# Patient Record
Sex: Female | Born: 1945 | Race: White | Hispanic: No | Marital: Married | Smoking: Never smoker
Health system: Southern US, Community
[De-identification: ages and names within clinical notes are randomized; demographics above are authoritative.]

## PROBLEM LIST (undated history)

## (undated) DIAGNOSIS — H269 Unspecified cataract: Secondary | ICD-10-CM

## (undated) DIAGNOSIS — R0602 Shortness of breath: Secondary | ICD-10-CM

## (undated) DIAGNOSIS — E782 Mixed hyperlipidemia: Secondary | ICD-10-CM

## (undated) DIAGNOSIS — T7840XA Allergy, unspecified, initial encounter: Secondary | ICD-10-CM

## (undated) DIAGNOSIS — M8580 Other specified disorders of bone density and structure, unspecified site: Secondary | ICD-10-CM

## (undated) DIAGNOSIS — T8549XA Other mechanical complication of breast prosthesis and implant, initial encounter: Secondary | ICD-10-CM

## (undated) HISTORY — PX: ABDOMINAL HYSTERECTOMY: SHX81

## (undated) HISTORY — DX: Allergy, unspecified, initial encounter: T78.40XA

## (undated) HISTORY — PX: HERNIA REPAIR: SHX51

## (undated) HISTORY — DX: Other mechanical complication of breast prosthesis and implant, initial encounter: T85.49XA

## (undated) HISTORY — DX: Other specified disorders of bone density and structure, unspecified site: M85.80

## (undated) HISTORY — DX: Shortness of breath: R06.02

## (undated) HISTORY — DX: Mixed hyperlipidemia: E78.2

## (undated) HISTORY — PX: BREAST BIOPSY: SHX20

## (undated) HISTORY — DX: Unspecified cataract: H26.9

---

## 2010-05-09 HISTORY — PX: AUGMENTATION MAMMAPLASTY: SUR837

## 2014-05-15 DIAGNOSIS — M810 Age-related osteoporosis without current pathological fracture: Secondary | ICD-10-CM | POA: Diagnosis not present

## 2014-05-23 DIAGNOSIS — M810 Age-related osteoporosis without current pathological fracture: Secondary | ICD-10-CM | POA: Diagnosis not present

## 2014-06-24 DIAGNOSIS — M810 Age-related osteoporosis without current pathological fracture: Secondary | ICD-10-CM | POA: Diagnosis not present

## 2015-02-21 DIAGNOSIS — Z23 Encounter for immunization: Secondary | ICD-10-CM | POA: Diagnosis not present

## 2015-06-23 DIAGNOSIS — Z1231 Encounter for screening mammogram for malignant neoplasm of breast: Secondary | ICD-10-CM | POA: Diagnosis not present

## 2015-06-23 DIAGNOSIS — N951 Menopausal and female climacteric states: Secondary | ICD-10-CM | POA: Diagnosis not present

## 2015-06-23 DIAGNOSIS — F330 Major depressive disorder, recurrent, mild: Secondary | ICD-10-CM | POA: Diagnosis not present

## 2015-06-23 DIAGNOSIS — H6121 Impacted cerumen, right ear: Secondary | ICD-10-CM | POA: Diagnosis not present

## 2015-06-29 DIAGNOSIS — L821 Other seborrheic keratosis: Secondary | ICD-10-CM | POA: Diagnosis not present

## 2015-06-29 DIAGNOSIS — L219 Seborrheic dermatitis, unspecified: Secondary | ICD-10-CM | POA: Diagnosis not present

## 2015-06-29 DIAGNOSIS — L814 Other melanin hyperpigmentation: Secondary | ICD-10-CM | POA: Diagnosis not present

## 2015-06-29 DIAGNOSIS — L719 Rosacea, unspecified: Secondary | ICD-10-CM | POA: Diagnosis not present

## 2015-06-29 DIAGNOSIS — D225 Melanocytic nevi of trunk: Secondary | ICD-10-CM | POA: Diagnosis not present

## 2015-06-29 DIAGNOSIS — D1801 Hemangioma of skin and subcutaneous tissue: Secondary | ICD-10-CM | POA: Diagnosis not present

## 2015-06-29 DIAGNOSIS — I781 Nevus, non-neoplastic: Secondary | ICD-10-CM | POA: Diagnosis not present

## 2015-06-29 DIAGNOSIS — Z23 Encounter for immunization: Secondary | ICD-10-CM | POA: Diagnosis not present

## 2015-07-24 DIAGNOSIS — H2513 Age-related nuclear cataract, bilateral: Secondary | ICD-10-CM | POA: Diagnosis not present

## 2015-07-24 DIAGNOSIS — H52203 Unspecified astigmatism, bilateral: Secondary | ICD-10-CM | POA: Diagnosis not present

## 2015-08-24 ENCOUNTER — Other Ambulatory Visit: Payer: Self-pay | Admitting: Family Medicine

## 2015-08-24 DIAGNOSIS — K7689 Other specified diseases of liver: Secondary | ICD-10-CM | POA: Diagnosis not present

## 2015-08-24 DIAGNOSIS — Z8505 Personal history of malignant neoplasm of liver: Secondary | ICD-10-CM | POA: Diagnosis not present

## 2015-08-24 DIAGNOSIS — N952 Postmenopausal atrophic vaginitis: Secondary | ICD-10-CM | POA: Diagnosis not present

## 2015-08-24 DIAGNOSIS — R32 Unspecified urinary incontinence: Secondary | ICD-10-CM | POA: Diagnosis not present

## 2015-08-24 DIAGNOSIS — Z1231 Encounter for screening mammogram for malignant neoplasm of breast: Secondary | ICD-10-CM

## 2015-08-24 DIAGNOSIS — B159 Hepatitis A without hepatic coma: Secondary | ICD-10-CM | POA: Diagnosis not present

## 2015-08-24 DIAGNOSIS — M810 Age-related osteoporosis without current pathological fracture: Secondary | ICD-10-CM | POA: Diagnosis not present

## 2015-09-15 ENCOUNTER — Ambulatory Visit
Admission: RE | Admit: 2015-09-15 | Discharge: 2015-09-15 | Disposition: A | Discharge status: Home / Self Care | Payer: Medicare Other | Source: Ambulatory Visit | Attending: Family Medicine | Admitting: Family Medicine

## 2015-09-15 DIAGNOSIS — Z1231 Encounter for screening mammogram for malignant neoplasm of breast: Secondary | ICD-10-CM | POA: Diagnosis not present

## 2015-11-17 DIAGNOSIS — Z136 Encounter for screening for cardiovascular disorders: Secondary | ICD-10-CM | POA: Diagnosis not present

## 2015-11-17 DIAGNOSIS — Z0000 Encounter for general adult medical examination without abnormal findings: Secondary | ICD-10-CM | POA: Diagnosis not present

## 2015-11-17 DIAGNOSIS — N952 Postmenopausal atrophic vaginitis: Secondary | ICD-10-CM | POA: Diagnosis not present

## 2015-11-17 DIAGNOSIS — Z23 Encounter for immunization: Secondary | ICD-10-CM | POA: Diagnosis not present

## 2015-11-17 DIAGNOSIS — F331 Major depressive disorder, recurrent, moderate: Secondary | ICD-10-CM | POA: Diagnosis not present

## 2015-11-17 DIAGNOSIS — Z1211 Encounter for screening for malignant neoplasm of colon: Secondary | ICD-10-CM | POA: Diagnosis not present

## 2015-11-17 DIAGNOSIS — F411 Generalized anxiety disorder: Secondary | ICD-10-CM | POA: Diagnosis not present

## 2015-11-17 DIAGNOSIS — R32 Unspecified urinary incontinence: Secondary | ICD-10-CM | POA: Diagnosis not present

## 2015-12-08 DIAGNOSIS — D1800 Hemangioma unspecified site: Secondary | ICD-10-CM | POA: Diagnosis not present

## 2015-12-08 DIAGNOSIS — L719 Rosacea, unspecified: Secondary | ICD-10-CM | POA: Diagnosis not present

## 2015-12-08 DIAGNOSIS — D229 Melanocytic nevi, unspecified: Secondary | ICD-10-CM | POA: Diagnosis not present

## 2015-12-08 DIAGNOSIS — L219 Seborrheic dermatitis, unspecified: Secondary | ICD-10-CM | POA: Diagnosis not present

## 2015-12-08 DIAGNOSIS — L814 Other melanin hyperpigmentation: Secondary | ICD-10-CM | POA: Diagnosis not present

## 2015-12-08 DIAGNOSIS — L821 Other seborrheic keratosis: Secondary | ICD-10-CM | POA: Diagnosis not present

## 2015-12-24 DIAGNOSIS — N3941 Urge incontinence: Secondary | ICD-10-CM | POA: Diagnosis not present

## 2016-01-18 DIAGNOSIS — F331 Major depressive disorder, recurrent, moderate: Secondary | ICD-10-CM | POA: Diagnosis not present

## 2016-01-18 DIAGNOSIS — R32 Unspecified urinary incontinence: Secondary | ICD-10-CM | POA: Diagnosis not present

## 2016-01-18 DIAGNOSIS — M1710 Unilateral primary osteoarthritis, unspecified knee: Secondary | ICD-10-CM | POA: Diagnosis not present

## 2016-01-18 DIAGNOSIS — Z23 Encounter for immunization: Secondary | ICD-10-CM | POA: Diagnosis not present

## 2016-05-17 DIAGNOSIS — Z23 Encounter for immunization: Secondary | ICD-10-CM | POA: Diagnosis not present

## 2016-07-25 DIAGNOSIS — H52203 Unspecified astigmatism, bilateral: Secondary | ICD-10-CM | POA: Diagnosis not present

## 2016-07-25 DIAGNOSIS — H2513 Age-related nuclear cataract, bilateral: Secondary | ICD-10-CM | POA: Diagnosis not present

## 2016-10-19 ENCOUNTER — Other Ambulatory Visit: Payer: Self-pay | Admitting: Family Medicine

## 2016-10-19 DIAGNOSIS — Z1231 Encounter for screening mammogram for malignant neoplasm of breast: Secondary | ICD-10-CM

## 2016-10-31 ENCOUNTER — Ambulatory Visit: Payer: Medicare Other

## 2016-11-21 ENCOUNTER — Ambulatory Visit
Admission: RE | Admit: 2016-11-21 | Discharge: 2016-11-21 | Disposition: A | Discharge status: Home / Self Care | Payer: Medicare Other | Source: Ambulatory Visit | Attending: Family Medicine | Admitting: Family Medicine

## 2016-11-21 DIAGNOSIS — Z1231 Encounter for screening mammogram for malignant neoplasm of breast: Secondary | ICD-10-CM | POA: Diagnosis not present

## 2016-11-22 ENCOUNTER — Other Ambulatory Visit: Payer: Self-pay | Admitting: Family Medicine

## 2016-11-22 DIAGNOSIS — H2512 Age-related nuclear cataract, left eye: Secondary | ICD-10-CM | POA: Diagnosis not present

## 2016-11-22 DIAGNOSIS — H25812 Combined forms of age-related cataract, left eye: Secondary | ICD-10-CM | POA: Diagnosis not present

## 2016-11-22 DIAGNOSIS — R928 Other abnormal and inconclusive findings on diagnostic imaging of breast: Secondary | ICD-10-CM

## 2016-11-25 ENCOUNTER — Ambulatory Visit
Admission: RE | Admit: 2016-11-25 | Discharge: 2016-11-25 | Disposition: A | Discharge status: Home / Self Care | Payer: Medicare Other | Source: Ambulatory Visit | Attending: Family Medicine | Admitting: Family Medicine

## 2016-11-25 ENCOUNTER — Other Ambulatory Visit: Payer: Self-pay | Admitting: Family Medicine

## 2016-11-25 DIAGNOSIS — R928 Other abnormal and inconclusive findings on diagnostic imaging of breast: Secondary | ICD-10-CM

## 2016-11-25 DIAGNOSIS — N6489 Other specified disorders of breast: Secondary | ICD-10-CM | POA: Diagnosis not present

## 2016-12-07 DIAGNOSIS — M810 Age-related osteoporosis without current pathological fracture: Secondary | ICD-10-CM | POA: Diagnosis not present

## 2016-12-07 DIAGNOSIS — R32 Unspecified urinary incontinence: Secondary | ICD-10-CM | POA: Diagnosis not present

## 2016-12-07 DIAGNOSIS — Z79899 Other long term (current) drug therapy: Secondary | ICD-10-CM | POA: Diagnosis not present

## 2016-12-07 DIAGNOSIS — F411 Generalized anxiety disorder: Secondary | ICD-10-CM | POA: Diagnosis not present

## 2016-12-07 DIAGNOSIS — M1710 Unilateral primary osteoarthritis, unspecified knee: Secondary | ICD-10-CM | POA: Diagnosis not present

## 2016-12-07 DIAGNOSIS — H6123 Impacted cerumen, bilateral: Secondary | ICD-10-CM | POA: Diagnosis not present

## 2016-12-07 DIAGNOSIS — Z1211 Encounter for screening for malignant neoplasm of colon: Secondary | ICD-10-CM | POA: Diagnosis not present

## 2016-12-07 DIAGNOSIS — Z0000 Encounter for general adult medical examination without abnormal findings: Secondary | ICD-10-CM | POA: Diagnosis not present

## 2016-12-07 DIAGNOSIS — N952 Postmenopausal atrophic vaginitis: Secondary | ICD-10-CM | POA: Diagnosis not present

## 2016-12-13 ENCOUNTER — Other Ambulatory Visit: Payer: Self-pay | Admitting: Family Medicine

## 2016-12-13 DIAGNOSIS — M810 Age-related osteoporosis without current pathological fracture: Secondary | ICD-10-CM

## 2016-12-20 ENCOUNTER — Other Ambulatory Visit: Payer: Medicare Other

## 2016-12-22 ENCOUNTER — Ambulatory Visit
Admission: RE | Admit: 2016-12-22 | Discharge: 2016-12-22 | Disposition: A | Discharge status: Home / Self Care | Payer: Medicare Other | Source: Ambulatory Visit | Attending: Family Medicine | Admitting: Family Medicine

## 2016-12-22 DIAGNOSIS — Z780 Asymptomatic menopausal state: Secondary | ICD-10-CM | POA: Diagnosis not present

## 2016-12-22 DIAGNOSIS — M85852 Other specified disorders of bone density and structure, left thigh: Secondary | ICD-10-CM | POA: Diagnosis not present

## 2016-12-22 DIAGNOSIS — M810 Age-related osteoporosis without current pathological fracture: Secondary | ICD-10-CM

## 2017-01-11 DIAGNOSIS — E559 Vitamin D deficiency, unspecified: Secondary | ICD-10-CM | POA: Diagnosis not present

## 2017-01-11 DIAGNOSIS — Z6822 Body mass index (BMI) 22.0-22.9, adult: Secondary | ICD-10-CM | POA: Diagnosis not present

## 2017-01-11 DIAGNOSIS — M8580 Other specified disorders of bone density and structure, unspecified site: Secondary | ICD-10-CM | POA: Diagnosis not present

## 2017-01-17 DIAGNOSIS — Z23 Encounter for immunization: Secondary | ICD-10-CM | POA: Diagnosis not present

## 2017-01-31 DIAGNOSIS — L905 Scar conditions and fibrosis of skin: Secondary | ICD-10-CM | POA: Diagnosis not present

## 2017-01-31 DIAGNOSIS — D485 Neoplasm of uncertain behavior of skin: Secondary | ICD-10-CM | POA: Diagnosis not present

## 2017-01-31 DIAGNOSIS — D2262 Melanocytic nevi of left upper limb, including shoulder: Secondary | ICD-10-CM | POA: Diagnosis not present

## 2017-01-31 DIAGNOSIS — Z23 Encounter for immunization: Secondary | ICD-10-CM | POA: Diagnosis not present

## 2017-01-31 DIAGNOSIS — D225 Melanocytic nevi of trunk: Secondary | ICD-10-CM | POA: Diagnosis not present

## 2017-01-31 DIAGNOSIS — D2272 Melanocytic nevi of left lower limb, including hip: Secondary | ICD-10-CM | POA: Diagnosis not present

## 2017-01-31 DIAGNOSIS — L821 Other seborrheic keratosis: Secondary | ICD-10-CM | POA: Diagnosis not present

## 2017-01-31 DIAGNOSIS — C44311 Basal cell carcinoma of skin of nose: Secondary | ICD-10-CM | POA: Diagnosis not present

## 2017-04-12 DIAGNOSIS — C44319 Basal cell carcinoma of skin of other parts of face: Secondary | ICD-10-CM | POA: Diagnosis not present

## 2017-05-12 DIAGNOSIS — E559 Vitamin D deficiency, unspecified: Secondary | ICD-10-CM | POA: Diagnosis not present

## 2017-05-12 DIAGNOSIS — E782 Mixed hyperlipidemia: Secondary | ICD-10-CM | POA: Diagnosis not present

## 2017-05-16 DIAGNOSIS — R32 Unspecified urinary incontinence: Secondary | ICD-10-CM | POA: Diagnosis not present

## 2017-05-16 DIAGNOSIS — M1710 Unilateral primary osteoarthritis, unspecified knee: Secondary | ICD-10-CM | POA: Diagnosis not present

## 2017-05-16 DIAGNOSIS — E559 Vitamin D deficiency, unspecified: Secondary | ICD-10-CM | POA: Diagnosis not present

## 2017-05-16 DIAGNOSIS — F331 Major depressive disorder, recurrent, moderate: Secondary | ICD-10-CM | POA: Diagnosis not present

## 2017-05-29 ENCOUNTER — Ambulatory Visit
Admission: RE | Admit: 2017-05-29 | Discharge: 2017-05-29 | Disposition: A | Discharge status: Home / Self Care | Payer: Medicare Other | Source: Ambulatory Visit | Attending: Family Medicine | Admitting: Family Medicine

## 2017-05-29 ENCOUNTER — Other Ambulatory Visit: Payer: Self-pay | Admitting: Family Medicine

## 2017-05-29 ENCOUNTER — Ambulatory Visit: Payer: Medicare Other

## 2017-05-29 DIAGNOSIS — Z1231 Encounter for screening mammogram for malignant neoplasm of breast: Secondary | ICD-10-CM

## 2017-05-29 DIAGNOSIS — R928 Other abnormal and inconclusive findings on diagnostic imaging of breast: Secondary | ICD-10-CM

## 2017-05-30 ENCOUNTER — Other Ambulatory Visit: Payer: Self-pay | Admitting: Family Medicine

## 2017-05-30 DIAGNOSIS — N63 Unspecified lump in unspecified breast: Secondary | ICD-10-CM

## 2017-08-15 DIAGNOSIS — L821 Other seborrheic keratosis: Secondary | ICD-10-CM | POA: Diagnosis not present

## 2017-08-15 DIAGNOSIS — D225 Melanocytic nevi of trunk: Secondary | ICD-10-CM | POA: Diagnosis not present

## 2017-08-15 DIAGNOSIS — D1801 Hemangioma of skin and subcutaneous tissue: Secondary | ICD-10-CM | POA: Diagnosis not present

## 2017-08-15 DIAGNOSIS — D2262 Melanocytic nevi of left upper limb, including shoulder: Secondary | ICD-10-CM | POA: Diagnosis not present

## 2017-08-15 DIAGNOSIS — D485 Neoplasm of uncertain behavior of skin: Secondary | ICD-10-CM | POA: Diagnosis not present

## 2017-08-15 DIAGNOSIS — Z85828 Personal history of other malignant neoplasm of skin: Secondary | ICD-10-CM | POA: Diagnosis not present

## 2017-08-15 DIAGNOSIS — L570 Actinic keratosis: Secondary | ICD-10-CM | POA: Diagnosis not present

## 2017-08-15 DIAGNOSIS — Z86018 Personal history of other benign neoplasm: Secondary | ICD-10-CM | POA: Diagnosis not present

## 2017-09-12 DIAGNOSIS — E559 Vitamin D deficiency, unspecified: Secondary | ICD-10-CM | POA: Diagnosis not present

## 2017-09-12 DIAGNOSIS — Z79899 Other long term (current) drug therapy: Secondary | ICD-10-CM | POA: Diagnosis not present

## 2017-09-14 ENCOUNTER — Ambulatory Visit
Admission: RE | Admit: 2017-09-14 | Discharge: 2017-09-14 | Disposition: A | Discharge status: Home / Self Care | Payer: Medicare Other | Source: Ambulatory Visit | Attending: Family Medicine | Admitting: Family Medicine

## 2017-09-14 ENCOUNTER — Other Ambulatory Visit: Payer: Self-pay | Admitting: Family Medicine

## 2017-09-14 DIAGNOSIS — M8580 Other specified disorders of bone density and structure, unspecified site: Secondary | ICD-10-CM | POA: Diagnosis not present

## 2017-09-14 DIAGNOSIS — R1031 Right lower quadrant pain: Secondary | ICD-10-CM

## 2017-09-14 DIAGNOSIS — M1611 Unilateral primary osteoarthritis, right hip: Secondary | ICD-10-CM | POA: Diagnosis not present

## 2017-09-14 DIAGNOSIS — E559 Vitamin D deficiency, unspecified: Secondary | ICD-10-CM | POA: Diagnosis not present

## 2017-09-14 DIAGNOSIS — Z1159 Encounter for screening for other viral diseases: Secondary | ICD-10-CM | POA: Diagnosis not present

## 2017-09-14 DIAGNOSIS — R32 Unspecified urinary incontinence: Secondary | ICD-10-CM | POA: Diagnosis not present

## 2017-09-19 ENCOUNTER — Other Ambulatory Visit: Payer: Self-pay

## 2017-09-19 ENCOUNTER — Encounter: Payer: Self-pay | Admitting: Physical Therapy

## 2017-09-19 ENCOUNTER — Ambulatory Visit: Payer: Medicare Other | Attending: Family Medicine | Admitting: Physical Therapy

## 2017-09-19 DIAGNOSIS — M25552 Pain in left hip: Secondary | ICD-10-CM | POA: Insufficient documentation

## 2017-09-19 DIAGNOSIS — M6281 Muscle weakness (generalized): Secondary | ICD-10-CM | POA: Insufficient documentation

## 2017-09-19 NOTE — Therapy (Signed)
[facility] [address], [place], [place], [address] Phone: [phone]   Fax:  [phone]  Physical Therapy Evaluation  Patient Details  Name: Julia Alvarez MRN: [id number] Date of Birth: 21-Oct-1945 Referring Provider: Dr. Rachell Cipro   Encounter Date: 09/19/2017  PT End of Session - 09/19/17 2255    Visit Number  1    Date for PT Re-Evaluation  11/14/17    Authorization Type  Medicare [employer]    PT Start Time  1530    PT Stop Time  1610    PT Time Calculation (min)  40 min    Activity Tolerance  Patient tolerated treatment well    Behavior During Therapy  [facility] for tasks assessed/performed       Past Medical History:  Diagnosis Date  . Allergy   . Cataract    skin cancer, no melanoma  . Osteopenia     Past Surgical History:  Procedure Laterality Date  . ABDOMINAL HYSTERECTOMY     complete  . AUGMENTATION MAMMAPLASTY Bilateral 05/09/2010  . HERNIA REPAIR     inguinal    There were no vitals filed for this visit.   Subjective Assessment - 09/19/17 1538    Subjective  Patient reports 10 months ago she was in [place] and walking up steps having a pain in right groin and had difficutly getting up the steps.     Patient Stated Goals  want to know her limits to not hurt herself, exercise without hurting herself    Currently in Pain?  Yes    Pain Score  7     Pain Location  Leg    Pain Orientation  Right    Pain Descriptors / Indicators  Burning;Radiating    Pain Type  Acute pain    Pain Radiating Towards  starts in right lower leg and radiates to the right buttocks then the groin    Pain Onset  More than a month ago    Pain Frequency  Intermittent    Aggravating Factors   walk 3 miles, walk up and down hills    Pain Relieving Factors  yoga to stretch the right side, piriformis stretch, quadriceps         [facility] PT Assessment - 09/19/17 0001      Assessment   Medical Diagnosis  OA left hip;  radicular symptoms down the right leg    Referring Provider  Dr. Rachell Cipro    Onset Date/Surgical Date  11/06/16    Prior Therapy  none      Precautions   Precautions  None      Restrictions   Weight Bearing Restrictions  No      Balance Screen   Has the patient fallen in the past 6 months  No    Has the patient had a decrease in activity level because of a fear of falling?   No    Is the patient reluctant to leave their home because of a fear of falling?   No      Home Film/video editor residence      Prior Function   Level of Independence  Independent      Cognition   Overall Cognitive Status  Within Functional Limits for tasks assessed      Observation/Other Assessments   Focus on Therapeutic Outcomes (FOTO)   Therapist discretion limited by 40%       Posture/Postural Control  Posture/Postural Control  No significant limitations      ROM / Strength   AROM / PROM / Strength  AROM;PROM;Strength      AROM   Overall AROM   Within functional limits for tasks performed      PROM   Right Hip External Rotation   55    Right Hip Internal Rotation   20      Strength   Right Hip ABduction  3+/5      Palpation   SI assessment   right ilium rotated posteriorly      Special Tests    Special Tests  Hip Special Tests    Hip Special Tests   Saralyn Pilar [facility]) Test      Saralyn Pilar [facility]) Test   Findings  Positive    Side  Right    Comments  pain in right groin                Objective measurements completed on examination: See above findings.              PT Education - 09/19/17 2255    Education provided  Yes    Education Details  hip stretches and strength    Person(s) Educated  Patient    Methods  Explanation;Demonstration;Verbal cues;Handout    Comprehension  Returned demonstration;Verbalized understanding       PT Short Term Goals - 09/19/17 2305      PT SHORT TERM GOAL #1   Title  independent with initial HEP     Time  4    Period  Weeks    Status  New    Target Date  10/17/17      PT SHORT TERM GOAL #2   Title  ambulate with pain decreased >/= 25% due to improved mobility of right hip joint    Time  4    Period  Weeks    Status  New    Target Date  10/17/17        PT Long Term Goals - 09/19/17 2307      PT LONG TERM GOAL #1   Title  independent with HEP     Time  8    Period  Weeks    Status  New    Target Date  11/14/17      PT LONG TERM GOAL #2   Title  ambulate for 30 minutes with pain decreased >/= 75% due to improved joint mobility    Time  8    Period  Weeks    Status  New    Target Date  11/14/17      PT LONG TERM GOAL #3   Title  pelvis in correct alignment for 4 weeks    Time  8    Period  Weeks    Status  New    Target Date  11/14/17             Plan - 09/19/17 2259    Clinical Impression Statement  Patient is a 72 year old female with right hip pain that started when she was walking up steps on vacation.  Patient reports pain level is 7/10 starting from her foot up to the right groin when she is walking.  Right ilium is rotated posteriorly and sacrum is rotated to the left.  Positive FABER test on the right.  right hip abduction is 3+5. Patient is going away for 2 weeks on vacation and will resume afterwards.  Patient will benefit from skilled therapy to improve strength and reduce pain with walking.     History and Personal Factors relevant to plan of care:  none significant    Clinical Presentation  Stable    Clinical Presentation due to:  stable condition    Clinical Decision Making  Low    Rehab Potential  Excellent    Clinical Impairments Affecting Rehab Potential  None    PT Frequency  2x / week    PT Duration  8 weeks    PT Treatment/Interventions  Cryotherapy;Electrical Stimulation;Iontophoresis 4mg /ml Dexamethasone;Moist Heat;Traction;Ultrasound;Therapeutic activities;Therapeutic exercise;Patient/family education;Neuromuscular  re-education;Manual techniques;Passive range of motion    PT Next Visit Plan  see if pelvis is correct; right hip abduction strength; hip ER/adduction range, right hip joint mobilization    PT Home Exercise Plan  progress as needed    Consulted and Agree with Plan of Care  Patient       Patient will benefit from skilled therapeutic intervention in order to improve the following deficits and impairments:  Pain, Increased fascial restricitons, Decreased mobility, Increased muscle spasms, Decreased activity tolerance, Decreased strength  Visit Diagnosis: Pain in left hip - Plan: PT plan of care cert/re-cert  Muscle weakness (generalized) - Plan: PT plan of care cert/re-cert     Problem List There are no active problems to display for this patient.   Earlie Counts, PT 09/19/17 11:13 PM   [facility] [address] [place], [place], [address] Phone: [phone]   Fax:  [phone]  Name: Sita Mangen MRN: [id number] Date of Birth: 1945-08-13

## 2017-09-19 NOTE — Patient Instructions (Addendum)
HIP: External Rotation    Sit at edge of surface. Cross one leg over other knee. Press down gently on knee. Hold _30__ seconds. _2__ reps per set, _1__ sets per day, Copyright  VHI. All rights reserved.   Therapeutic - Bridging    Lift buttocks, keeping back straight and arms on floor. Hold ___1 seconds. Repeat _15___ times. Keep pelvis flat Copyright  VHI. All rights reserved.  Abduction / External Rotation: ROM (Supine)    You move your right leg in and out while tightening your stomach. 20 times. Do not let pelvis rock.   Copyright  VHI. All rights reserved.  [facility] [address] [place], [place] [address] Phone # [phone] Fax [phone] [email or web address]

## 2017-09-20 ENCOUNTER — Encounter

## 2017-10-06 ENCOUNTER — Ambulatory Visit: Payer: Medicare Other | Admitting: Physical Therapy

## 2017-10-06 ENCOUNTER — Encounter: Payer: Self-pay | Admitting: Physical Therapy

## 2017-10-06 DIAGNOSIS — M6281 Muscle weakness (generalized): Secondary | ICD-10-CM

## 2017-10-06 DIAGNOSIS — M25552 Pain in left hip: Secondary | ICD-10-CM

## 2017-10-06 NOTE — Patient Instructions (Signed)
Access Code: [email or web address]  URL: [email or web address]  Date: 10/06/2017  Prepared by: Earlie Counts   Exercises  Standing Hip Abduction with Resistance on Sound Leg (BKA) - 10 reps - 1 sets - 1x daily - 7x weekly  Hip Extension with Resistance Loop - 10 reps - 1 sets - 1x daily - 7x weekly  Standing Hip Adduction with Resistance - 10 reps - 1 sets - 1x daily - 7x weekly  Step Up - 15 reps - 1 sets - 1x daily - 7x weekly  Lateral Step Up - 15 reps - 1 sets - 1x daily - 7x weekly  [facility] [address], [place], [place] [address] Phone # [phone] Fax [phone]

## 2017-10-06 NOTE — Therapy (Signed)
[facility] [address], [place], [place], [address] Phone: [phone]   Fax:  [phone]  Physical Therapy Treatment  Patient Details  Name: Julia Alvarez MRN: [id number] Date of Birth: [birth year] Referring Provider: Dr. Rachell Cipro   Encounter Date: 10/06/2017  PT End of Session - 10/06/17 1145    Visit Number  2    Date for PT Re-Evaluation  11/14/17    Authorization Type  Medicare [employer]    PT Start Time  1100    PT Stop Time  1140    PT Time Calculation (min)  40 min    Activity Tolerance  Patient tolerated treatment well    Behavior During Therapy  [facility] for tasks assessed/performed       Past Medical History:  Diagnosis Date  . Allergy   . Cataract    skin cancer, no melanoma  . Osteopenia     Past Surgical History:  Procedure Laterality Date  . ABDOMINAL HYSTERECTOMY     complete  . AUGMENTATION MAMMAPLASTY Bilateral 05/09/2010  . HERNIA REPAIR     inguinal    There were no vitals filed for this visit.  Subjective Assessment - 10/06/17 1105    Subjective  I feel better.  No pain in back.  The groin area with no pain except of one exercise.  No pain with steps. I have not done my 5 mile walk.  I did some yoga with no difficulty.     Patient Stated Goals  want to know her limits to not hurt herself, exercise without hurting herself    Currently in Pain?  No/denies    Multiple Pain Sites  No                       [facility] Adult PT Treatment/Exercise - 10/06/17 0001      Exercises   Exercises  Knee/Hip      Knee/Hip Exercises: Aerobic   Nustep  seat #7, arm #11, level 3 x 6 min      Knee/Hip Exercises: Machines for Strengthening   Cybex Leg Press  Seat #5, bil. 80# 30x      Knee/Hip Exercises: Standing   Hip ADduction  Strengthening;Right;1 set;10 reps red band    Hip Abduction  Stengthening;Right;1 set;10 reps;Knee straight red band    Hip Extension   Stengthening;Right;1 set;10 reps;Knee straight red band    Lateral Step Up  Right;1 set;15 reps;Hand Hold: 0;Step Height: 6"    Forward Step Up  Right;1 set;15 reps;Step Height: 6";Hand Hold: 0      Manual Therapy   Manual Therapy  Joint mobilization;Soft tissue mobilization    Manual therapy comments  contract relax for hip external rotation and hip abduction    Joint Mobilization  distraciton, posterior and inferior glide grade 3 for right hip    Soft tissue mobilization  right quads, hip adductors             PT Education - 10/06/17 1141    Education provided  Yes    Education Details  Access Code: [email or web address]    Person(s) Educated  Patient    Methods  Explanation;Demonstration;Verbal cues;Handout    Comprehension  Verbalized understanding;Returned demonstration       PT Short Term Goals - 10/06/17 1145      PT SHORT TERM GOAL #1   Title  independent with initial HEP    Time  4    Period  Weeks    Status  Achieved      PT SHORT TERM GOAL #2   Title  ambulate with pain decreased >/= 25% due to improved mobility of right hip joint    Time  4    Period  Weeks    Status  Achieved        PT Long Term Goals - 09/19/17 2307      PT LONG TERM GOAL #1   Title  independent with HEP     Time  8    Period  Weeks    Status  New    Target Date  11/14/17      PT LONG TERM GOAL #2   Title  ambulate for 30 minutes with pain decreased >/= 75% due to improved joint mobility    Time  8    Period  Weeks    Status  New    Target Date  11/14/17      PT LONG TERM GOAL #3   Title  pelvis in correct alignment for 4 weeks    Time  8    Period  Weeks    Status  New    Target Date  11/14/17            Plan - 10/06/17 1141    Clinical Impression Statement  Patient had pain in right hip with external rotation.  Patient has not had pain down right leg for 2 weeks.  Patient was able to externally rotate with hip abduction on right after manual work.  Patient able to do the  hip exercises in standing with hips leveled and erect trunk.  Patient will benefi tfrom skilled therapy to imrpove strength and reduce pain with walking.     Rehab Potential  Excellent    Clinical Impairments Affecting Rehab Potential  None    PT Frequency  2x / week    PT Duration  8 weeks    PT Treatment/Interventions  Cryotherapy;Electrical Stimulation;Iontophoresis 4mg /ml Dexamethasone;Moist Heat;Traction;Ultrasound;Therapeutic activities;Therapeutic exercise;Patient/family education;Neuromuscular re-education;Manual techniques;Passive range of motion    PT Next Visit Plan  see if pelvis is correct; right hip abduction strength; hip ER/adduction range, right hip joint mobilization    PT Home Exercise Plan  Access Code: [email or web address]    Consulted and Agree with Plan of Care  Patient       Patient will benefit from skilled therapeutic intervention in order to improve the following deficits and impairments:  Pain, Increased fascial restricitons, Decreased mobility, Increased muscle spasms, Decreased activity tolerance, Decreased strength  Visit Diagnosis: Pain in left hip  Muscle weakness (generalized)     Problem List There are no active problems to display for this patient.   Earlie Counts, PT 10/06/17 11:46 AM   [facility] [address] [place], [place], [address] Phone: [phone]   Fax:  [phone]  Name: Julia Alvarez MRN: [id number] Date of Birth: [birth year]

## 2017-10-09 ENCOUNTER — Encounter: Payer: Self-pay | Admitting: Physical Therapy

## 2017-10-09 ENCOUNTER — Ambulatory Visit: Payer: Medicare Other | Attending: Family Medicine | Admitting: Physical Therapy

## 2017-10-09 DIAGNOSIS — M6281 Muscle weakness (generalized): Secondary | ICD-10-CM | POA: Diagnosis not present

## 2017-10-09 DIAGNOSIS — M25552 Pain in left hip: Secondary | ICD-10-CM | POA: Insufficient documentation

## 2017-10-09 NOTE — Therapy (Signed)
[facility] [address] [place], [place], [address] Phone: [phone]   Fax:  [phone]  Physical Therapy Treatment  Patient Details  Name: Julia Alvarez MRN: [id number] Date of Birth: December 30, 1945 Referring Provider: Dr. Rachell Cipro   Encounter Date: 10/09/2017  PT End of Session - 10/09/17 1437    Visit Number  3    Date for PT Re-Evaluation  11/14/17    Authorization Type  Medicare T[employer]    PT Start Time  1400    PT Stop Time  1450    PT Time Calculation (min)  50 min    Activity Tolerance  Patient tolerated treatment well    Behavior During Therapy  [facility] for tasks assessed/performed       Past Medical History:  Diagnosis Date  . Allergy   . Cataract    skin cancer, no melanoma  . Osteopenia     Past Surgical History:  Procedure Laterality Date  . ABDOMINAL HYSTERECTOMY     complete  . AUGMENTATION MAMMAPLASTY Bilateral 05/09/2010  . HERNIA REPAIR     inguinal    There were no vitals filed for this visit.  Subjective Assessment - 10/09/17 1404    Subjective  I feel the pain at the end of the day after I walked and it goes to my right knee. The first time I did 5 miles in 3 weeks.      Patient Stated Goals  want to know her limits to not hurt herself, exercise without hurting herself    Currently in Pain?  No/denies         [facility] PT Assessment - 10/09/17 0001      Palpation   SI assessment   pelvis in correct algnment                   [facility] Adult PT Treatment/Exercise - 10/09/17 0001      Knee/Hip Exercises: Aerobic   Nustep  seat #7, arm #11, level 3 x 6 min      Knee/Hip Exercises: Supine   Other Supine Knee/Hip Exercises  right hip ER with knee bent 20 times, then fiqure 4 stretch      Modalities   Modalities  Traction      Traction   Type of Traction  Lumbar    Min (lbs)  65    Max (lbs)  40    Time  15      Manual Therapy   Manual Therapy  Joint  mobilization;Soft tissue mobilization    Joint Mobilization  distraciton, posterior and inferior glide grade 3 for right hip; joint mobilization at the arch of the right foot due to tightness and pain starts at foot    Soft tissue mobilization  arch of right foot                PT Short Term Goals - 10/06/17 1145      PT SHORT TERM GOAL #1   Title  independent with initial HEP    Time  4    Period  Weeks    Status  Achieved      PT SHORT TERM GOAL #2   Title  ambulate with pain decreased >/= 25% due to improved mobility of right hip joint    Time  4    Period  Weeks    Status  Achieved        PT Long Term Goals - 10/09/17 1441  PT LONG TERM GOAL #1   Title  independent with HEP     Baseline  still learning    Time  8    Period  Weeks    Status  On-going      PT LONG TERM GOAL #2   Title  ambulate for 30 minutes with pain decreased >/= 75% due to improved joint mobility    Baseline  walked 5 miles    Period  Weeks    Status  On-going      PT LONG TERM GOAL #3   Title  pelvis in correct alignment for 4 weeks    Baseline  still in correct alignment    Time  8    Period  Weeks    Status  On-going            Plan - 10/09/17 1406    Clinical Impression Statement  Patient reports her pain in right leg does not come up to her groin.  Instead goes to her knee. Patient pelvis in in correct alignment. Patient has tightness in right hip especially posterior and inferior.  Patient had tightness in the right arch that could cause pain going from her foot up her leg.  Patient is trying lumbar traction to see if it reduces pain down right leg. Patient will benefit from skilled therapy to improve strength and reduce pain with walking.     Rehab Potential  Excellent    Clinical Impairments Affecting Rehab Potential  None    PT Frequency  2x / week    PT Duration  8 weeks    PT Treatment/Interventions  Cryotherapy;Electrical Stimulation;Iontophoresis 4mg /ml  Dexamethasone;Moist Heat;Traction;Ultrasound;Therapeutic activities;Therapeutic exercise;Patient/family education;Neuromuscular re-education;Manual techniques;Passive range of motion    PT Next Visit Plan   right hip abduction strength; hip ER/adduction range, right hip joint mobilization    Recommended Other Services  MD signed the initial eval    Consulted and Agree with Plan of Care  Patient       Patient will benefit from skilled therapeutic intervention in order to improve the following deficits and impairments:  Pain, Increased fascial restricitons, Decreased mobility, Increased muscle spasms, Decreased activity tolerance, Decreased strength  Visit Diagnosis: Pain in left hip  Muscle weakness (generalized)     Problem List There are no active problems to display for this patient.   Earlie Counts, PT 10/09/17 2:42 PM   [facility] [address], [place], [place], [address] Phone: [phone]   Fax:  [phone]  Name: Julia Alvarez MRN: [id number] Date of Birth: 1945/11/15

## 2017-10-11 DIAGNOSIS — L988 Other specified disorders of the skin and subcutaneous tissue: Secondary | ICD-10-CM | POA: Diagnosis not present

## 2017-10-11 DIAGNOSIS — D485 Neoplasm of uncertain behavior of skin: Secondary | ICD-10-CM | POA: Diagnosis not present

## 2017-10-13 ENCOUNTER — Encounter: Payer: Medicare Other | Admitting: Physical Therapy

## 2017-10-18 ENCOUNTER — Encounter: Payer: Self-pay | Admitting: Physical Therapy

## 2017-10-18 ENCOUNTER — Ambulatory Visit: Payer: Medicare Other | Admitting: Physical Therapy

## 2017-10-18 DIAGNOSIS — M25552 Pain in left hip: Secondary | ICD-10-CM | POA: Diagnosis not present

## 2017-10-18 DIAGNOSIS — M6281 Muscle weakness (generalized): Secondary | ICD-10-CM

## 2017-10-18 NOTE — Therapy (Signed)
[facility] [address] [place], [place], [address] Phone: [phone]   Fax:  [phone]  Physical Therapy Treatment  Patient Details  Name: Alejandro Adcox MRN: [id number] Date of Birth: [birth year] Referring Provider: Dr. Rachell Cipro   Encounter Date: 10/18/2017  PT End of Session - 10/18/17 1116    Visit Number  4    Date for PT Re-Evaluation  11/14/17    Authorization Type  Medicare T[employer]    PT Start Time  1100    PT Stop Time  1140    PT Time Calculation (min)  40 min    Activity Tolerance  Patient tolerated treatment well    Behavior During Therapy  [facility] for tasks assessed/performed       Past Medical History:  Diagnosis Date  . Allergy   . Cataract    skin cancer, no melanoma  . Osteopenia     Past Surgical History:  Procedure Laterality Date  . ABDOMINAL HYSTERECTOMY     complete  . AUGMENTATION MAMMAPLASTY Bilateral 05/09/2010  . HERNIA REPAIR     inguinal    There were no vitals filed for this visit.  Subjective Assessment - 10/18/17 1106    Subjective  I still get pain on top of foot.  The right groin and thigh is less. When my right knee is bent and bring the right knee out then when it comes back in I have pain.     Patient Stated Goals  want to know her limits to not hurt herself, exercise without hurting herself    Currently in Pain?  Yes    Pain Score  5     Pain Location  Groin    Pain Orientation  Right    Pain Descriptors / Indicators  -- deep    Pain Type  Acute pain    Pain Onset  More than a month ago    Pain Frequency  Intermittent    Aggravating Factors   when right hip is brought out to side then comes inward    Pain Relieving Factors  yoga stretch    Multiple Pain Sites  No                       [facility] Adult PT Treatment/Exercise - 10/18/17 0001      Knee/Hip Exercises: Supine   Bridges  Strengthening;Both;1 set;15 reps    Other Supine Knee/Hip  Exercises  supine right hip clam with overpressure to right hip for anterior glide; figure 4 position with contract relax inward ;       Manual Therapy   Manual Therapy  Passive ROM    Manual therapy comments  manual stretch of Left hip adductor stretch with hip at 90 degrees flexion; right hip ER stretch and stretch posterior capsule    Joint Mobilization  distraction with anterior glide of right hip with figure 4 stretch and right hip in adduction                PT Short Term Goals - 10/06/17 1145      PT SHORT TERM GOAL #1   Title  independent with initial HEP    Time  4    Period  Weeks    Status  Achieved      PT SHORT TERM GOAL #2   Title  ambulate with pain decreased >/= 25% due to improved mobility of right hip joint  Time  4    Period  Weeks    Status  Achieved        PT Long Term Goals - 10/18/17 1202      PT LONG TERM GOAL #2   Title  ambulate for 30 minutes with pain decreased >/= 75% due to improved joint mobility    Time  8    Period  Weeks    Status  Achieved      PT LONG TERM GOAL #3   Title  pelvis in correct alignment for 4 weeks    Time  8    Period  Weeks    Status  Achieved            Plan - 10/18/17 1110    Clinical Impression Statement  Patient has pain with ER of right hip in hookly and bring right leg inward. Patient right leg pain is reduced.  Patient is not having back pain.  Patient is able to walk and go up hills without right hip pain.  Patient will benefit from skilled therapy to reduce right hip pain and improve mobility.     Rehab Potential  Excellent    Clinical Impairments Affecting Rehab Potential  None    PT Frequency  2x / week    PT Duration  8 weeks    PT Treatment/Interventions  Cryotherapy;Electrical Stimulation;Iontophoresis 4mg /ml Dexamethasone;Moist Heat;Traction;Ultrasound;Therapeutic activities;Therapeutic exercise;Patient/family education;Neuromuscular re-education;Manual techniques;Passive range of motion     PT Next Visit Plan   right hip abduction strength; hip ER/adduction range, right hip joint mobilization    Consulted and Agree with Plan of Care  Patient       Patient will benefit from skilled therapeutic intervention in order to improve the following deficits and impairments:  Pain, Increased fascial restricitons, Decreased mobility, Increased muscle spasms, Decreased activity tolerance, Decreased strength  Visit Diagnosis: Pain in left hip  Muscle weakness (generalized)     Problem List There are no active problems to display for this patient.   Earlie Counts, PT 10/18/17 12:06 PM   [facility] [address], [place], [place], [address] Phone: [phone]   Fax:  [phone]  Name: Maritsa Hunsucker MRN: [id number] Date of Birth: [birth year]

## 2017-10-20 ENCOUNTER — Encounter: Payer: Medicare Other | Admitting: Physical Therapy

## 2017-10-23 ENCOUNTER — Encounter: Payer: Self-pay | Admitting: Physical Therapy

## 2017-10-23 ENCOUNTER — Ambulatory Visit: Payer: Medicare Other | Admitting: Physical Therapy

## 2017-10-23 DIAGNOSIS — M6281 Muscle weakness (generalized): Secondary | ICD-10-CM

## 2017-10-23 DIAGNOSIS — M25552 Pain in left hip: Secondary | ICD-10-CM | POA: Diagnosis not present

## 2017-10-23 NOTE — Patient Instructions (Signed)
Access Code: [email or web address]  URL: [email or web address]  Date: 10/23/2017  Prepared by: Earlie Counts   Exercises  Standing Hip Abduction with Resistance on Sound Leg (BKA) - 10 reps - 1 sets - 1x daily - 7x weekly  Hip Extension with Resistance Loop - 10 reps - 1 sets - 1x daily - 7x weekly  Standing Hip Adduction with Resistance - 10 reps - 1 sets - 1x daily - 7x weekly  Step Up - 15 reps - 1 sets - 1x daily - 7x weekly  Lateral Step Up - 15 reps - 1 sets - 1x daily - 7x weekly  Sidelying Hip Abduction - 10 reps - 2 sets - 1x daily - 7x weekly  Sidelying Hip Circles - 10 reps - 2 sets - 1x daily - 7x weekly [facility] [address], [place], [place] [address] Phone # [phone] Fax [phone]

## 2017-10-23 NOTE — Therapy (Signed)
[facility] [address], [place], [place], [address] Phone: [phone]   Fax:  [phone]  Physical Therapy Treatment  Patient Details  Name: Julia Alvarez MRN: [id number] Date of Birth: [birth year] Referring Provider: Dr. Rachell Cipro   Encounter Date: 10/23/2017  PT End of Session - 10/23/17 1454    Visit Number  5    Date for PT Re-Evaluation  11/14/17    Authorization Type  Medicare T[employer]    PT Start Time  1445    PT Stop Time  1525    PT Time Calculation (min)  40 min    Activity Tolerance  Patient tolerated treatment well    Behavior During Therapy  [facility] for tasks assessed/performed       Past Medical History:  Diagnosis Date  . Allergy   . Cataract    skin cancer, no melanoma  . Osteopenia     Past Surgical History:  Procedure Laterality Date  . ABDOMINAL HYSTERECTOMY     complete  . AUGMENTATION MAMMAPLASTY Bilateral 05/09/2010  . HERNIA REPAIR     inguinal    There were no vitals filed for this visit.  Subjective Assessment - 10/23/17 1449    Subjective  I did some aerobics and went walking then I had the pain. Hopping in the aerobics increase pain.     Patient Stated Goals  want to know her limits to not hurt herself, exercise without hurting herself    Currently in Pain?  No/denies    Multiple Pain Sites  No         [facility] PT Assessment - 10/23/17 0001      Palpation   SI assessment   pelvis in correct algnment      Special Tests    Special Tests  Hip Special Tests    Hip Special Tests   Saralyn Pilar [facility]) Test      Saralyn Pilar [facility]) Test   Findings  Negative    Side  Right    Comments  stretch no pain                   [facility] Adult PT Treatment/Exercise - 10/23/17 0001      Exercises   Exercises  Other Exercises    Other Exercises   instructions on how to eliminate jumping exercises during her jumping classes to decrease strain on her right hip and foot.        Knee/Hip Exercises: Stretches   Other Knee/Hip Stretches  manually stretched right hip adductors, hip flexor, ITB, and hamstring      Knee/Hip Exercises: Aerobic   Stationary Bike  6 min for level 1      Knee/Hip Exercises: Standing   Side Lunges  Right;1 set;15 reps no pain    SLS with Vectors  stand on left, push right sideways and backwards 15x each with foot on slider      Knee/Hip Exercises: Sidelying   Hip ABduction  Strengthening;Right;1 set;15 reps    Other Sidelying Knee/Hip Exercises  sidely hip circles 10x each way with tactile cues to keep hip stable      Manual Therapy   Manual Therapy  Soft tissue mobilization;Joint mobilization    Joint Mobilization  distraction of the cuboid bones     Soft tissue mobilization  arch o fright foot; stretch the dorsiflexors of the right foot             PT Education - 10/23/17 1526  Education provided  Yes    Education Details  Access Code: [email or web address]     Person(s) Educated  Patient    Methods  Explanation;Demonstration;Verbal cues;Handout    Comprehension  Returned demonstration;Verbalized understanding       PT Short Term Goals - 10/06/17 1145      PT SHORT TERM GOAL #1   Title  independent with initial HEP    Time  4    Period  Weeks    Status  Achieved      PT SHORT TERM GOAL #2   Title  ambulate with pain decreased >/= 25% due to improved mobility of right hip joint    Time  4    Period  Weeks    Status  Achieved        PT Long Term Goals - 10/23/17 1531      PT LONG TERM GOAL #1   Title  independent with HEP     Baseline  still learning    Time  8    Period  Weeks      PT LONG TERM GOAL #2   Title  ambulate for 30 minutes with pain decreased >/= 75% due to improved joint mobility    Baseline  walked 5 miles    Time  8    Period  Weeks    Status  Achieved            Plan - 10/23/17 1518    Clinical Impression Statement  Patient has increased strength and mobility of right hip. Patient  had pain in right foot and hip when she jumped in aerobics and then walked 7 miles.  Patient was instructed on ways to avoid jumping in exercise class.  Patient had decreased mobility of right arch and after manual skills she had improved movement.  Patient has learned more HEP exercises.  Patient will benefit from skilled therapy to reduce reight hip apin and improve mobility.     Rehab Potential  Excellent    Clinical Impairments Affecting Rehab Potential  None    PT Frequency  2x / week    PT Duration  8 weeks    PT Treatment/Interventions  Cryotherapy;Electrical Stimulation;Iontophoresis 4mg /ml Dexamethasone;Moist Heat;Traction;Ultrasound;Therapeutic activities;Therapeutic exercise;Patient/family education;Neuromuscular re-education;Manual techniques;Passive range of motion    PT Next Visit Plan   right hip abduction strength; hip ER/adduction range, right hip joint mobilization    PT Home Exercise Plan  Access Code: [email or web address]    Consulted and Agree with Plan of Care  Patient       Patient will benefit from skilled therapeutic intervention in order to improve the following deficits and impairments:  Pain, Increased fascial restricitons, Decreased mobility, Increased muscle spasms, Decreased activity tolerance, Decreased strength  Visit Diagnosis: Pain in left hip  Muscle weakness (generalized)     Problem List There are no active problems to display for this patient.   Earlie Counts, PT 10/23/17 3:32 PM    [facility] [address], [place], [place], [address] Phone: [phone]   Fax:  [phone]  Name: Julia Alvarez MRN: [id number] Date of Birth: Sep 27, 1945

## 2017-11-01 ENCOUNTER — Encounter: Payer: Self-pay | Admitting: Physical Therapy

## 2017-11-01 ENCOUNTER — Ambulatory Visit: Payer: Medicare Other | Admitting: Physical Therapy

## 2017-11-01 DIAGNOSIS — M6281 Muscle weakness (generalized): Secondary | ICD-10-CM

## 2017-11-01 DIAGNOSIS — M25552 Pain in left hip: Secondary | ICD-10-CM | POA: Diagnosis not present

## 2017-11-01 NOTE — Therapy (Signed)
[facility] [address] [place], [place], [address] Phone: [phone]   Fax:  [phone]  Physical Therapy Treatment  Patient Details  Name: Julia Alvarez MRN: [id number] Date of Birth: Aug 01, 1945 Referring Provider: Dr. Rachell Cipro   Encounter Date: 11/01/2017  PT End of Session - 11/01/17 1118    Visit Number  6    Date for PT Re-Evaluation  11/14/17    Authorization Type  Medicare [employer]    PT Start Time  1015    PT Stop Time  1045 lost power in facility    PT Time Calculation (min)  30 min    Activity Tolerance  Patient tolerated treatment well    Behavior During Therapy  [facility] for tasks assessed/performed       Past Medical History:  Diagnosis Date  . Allergy   . Cataract    skin cancer, no melanoma  . Osteopenia     Past Surgical History:  Procedure Laterality Date  . ABDOMINAL HYSTERECTOMY     complete  . AUGMENTATION MAMMAPLASTY Bilateral 05/09/2010  . HERNIA REPAIR     inguinal    There were no vitals filed for this visit.  Subjective Assessment - 11/01/17 1107    Subjective  I went hiking for 4 miles and had a pain in right calf and leg.  When I walk on flat ground no pain.  When I walk on hills I have pain. Today I am fine.  I am doing my exercises and yoga.      Patient Stated Goals  want to know her limits to not hurt herself, exercise without hurting herself    Currently in Pain?  No/denies         [facility] PT Assessment - 11/01/17 0001      Assessment   Medical Diagnosis  OA left hip; radicular symptoms down the right leg    Referring Provider  Dr. Rachell Cipro    Onset Date/Surgical Date  11/06/16    Prior Therapy  none      Precautions   Precautions  None      Restrictions   Weight Bearing Restrictions  No      Balance Screen   Has the patient fallen in the past 6 months  No    Has the patient had a decrease in activity level because of a fear of falling?   No     Is the patient reluctant to leave their home because of a fear of falling?   No      Home Film/video editor residence      Prior Function   Level of Independence  Independent      Cognition   Overall Cognitive Status  Within Functional Limits for tasks assessed      Observation/Other Assessments   Focus on Therapeutic Outcomes (FOTO)   Therapist discretion limited by 10%       PROM   Right Hip External Rotation   55      Strength   Right Hip ABduction  4+/5      Palpation   SI assessment   pelvis in correct algnment      Special Tests    Special Tests  Hip Special Tests    Hip Special Tests   Saralyn Pilar [facility]) Test      Saralyn Pilar [facility]) Test   Findings  Negative    Side  Right    Comments  stretch no pain      Transfers   Transfers  Not assessed      Ambulation/Gait   Ambulation/Gait  No                   [facility] Adult PT Treatment/Exercise - 11/01/17 0001      Knee/Hip Exercises: Stretches   Active Hamstring Stretch  Right;2 reps;30 seconds    Quad Stretch  Right;2 reps;30 seconds    Piriformis Stretch  Right;5 reps;30 seconds supine and sitting    Other Knee/Hip Stretches  neural tension stretch with band and hip adduction with band      Knee/Hip Exercises: Aerobic   Stationary Bike  6 min for level 4      Knee/Hip Exercises: Standing   Side Lunges  Right;1 set;15 reps no pain    Hip ADduction  Right;1 set;10 reps yellow band    Hip Abduction  Stengthening;Right;1 set;10 reps;Knee straight yellow band    Hip Extension  Right;Stengthening;1 set;10 reps;Knee straight yellow band    Forward Step Up  Right;15 reps;Hand Hold: 0;Step Height: 6"      Knee/Hip Exercises: Supine   Other Supine Knee/Hip Exercises  right hip ER with keeping the left hip on mat      Knee/Hip Exercises: Prone   Other Prone Exercises  prone hip ER to stretch               PT Short Term Goals - 10/06/17 1145      PT SHORT TERM GOAL #1    Title  independent with initial HEP    Time  4    Period  Weeks    Status  Achieved      PT SHORT TERM GOAL #2   Title  ambulate with pain decreased >/= 25% due to improved mobility of right hip joint    Time  4    Period  Weeks    Status  Achieved        PT Long Term Goals - 11/01/17 1120      PT LONG TERM GOAL #1   Title  independent with HEP     Time  8    Period  Weeks    Status  Achieved      PT LONG TERM GOAL #2   Title  ambulate for 30 minutes with pain decreased >/= 75% due to improved joint mobility    Time  8    Period  Weeks    Status  Achieved      PT LONG TERM GOAL #3   Title  pelvis in correct alignment for 4 weeks    Time  8    Period  Weeks    Status  Achieved            Plan - 11/01/17 1118    Clinical Impression Statement  Patient is not having pain with walking exept up hills for 4 miles.  Patient is independent with her HEP and also doing yoga.  Patient wanted to start jogging but therapist advised against it due to the impact on the right hip. Patient reports the exerises help her manage the pain when she has it. Patient is ready for discharge.     Rehab Potential  Excellent    Clinical Impairments Affecting Rehab Potential  None    PT Frequency  2x / week    PT Duration  8 weeks    PT Treatment/Interventions  Cryotherapy;Electrical Stimulation;Iontophoresis 91m/ml  Dexamethasone;Moist Heat;Traction;Ultrasound;Therapeutic activities;Therapeutic exercise;Patient/family education;Neuromuscular re-education;Manual techniques;Passive range of motion    PT Next Visit Plan  discharge to HEP this visit    PT Home Exercise Plan  Access Code: [email or web address]    Consulted and Agree with Plan of Care  Patient       Patient will benefit from skilled therapeutic intervention in order to improve the following deficits and impairments:  Pain, Increased fascial restricitons, Decreased mobility, Increased muscle spasms, Decreased activity tolerance, Decreased  strength  Visit Diagnosis: Pain in left hip  Muscle weakness (generalized)     Problem List There are no active problems to display for this patient.   Julia Alvarez 11/01/2017, 11:23 AM  [facility] [address] [place], [place], [address] Phone: [phone]   Fax:  [phone]  Name: Julia Alvarez MRN: [id number] Date of Birth: 1945-11-24 PHYSICAL THERAPY DISCHARGE SUMMARY  Visits from Start of Care: 6 Current functional level related to goals / functional outcomes: See above.  Remaining deficits: See above. Patient will have pain in right hip when walking up hills on a 4 mile walk.    Education / Equipment: HEP Plan: Patient agrees to discharge.  Patient goals were met. Patient is being discharged due to meeting the stated rehab goals. Thank you for the referral.  Earlie Counts, PT 11/01/17 11:23 AM  ?????

## 2017-11-29 ENCOUNTER — Ambulatory Visit
Admission: RE | Admit: 2017-11-29 | Discharge: 2017-11-29 | Disposition: A | Discharge status: Home / Self Care | Payer: Medicare Other | Source: Ambulatory Visit | Attending: Family Medicine | Admitting: Family Medicine

## 2017-11-29 DIAGNOSIS — R928 Other abnormal and inconclusive findings on diagnostic imaging of breast: Secondary | ICD-10-CM | POA: Diagnosis not present

## 2017-11-29 DIAGNOSIS — N63 Unspecified lump in unspecified breast: Secondary | ICD-10-CM

## 2017-12-13 DIAGNOSIS — H6123 Impacted cerumen, bilateral: Secondary | ICD-10-CM | POA: Diagnosis not present

## 2017-12-13 DIAGNOSIS — Z0000 Encounter for general adult medical examination without abnormal findings: Secondary | ICD-10-CM | POA: Diagnosis not present

## 2017-12-13 DIAGNOSIS — Z6821 Body mass index (BMI) 21.0-21.9, adult: Secondary | ICD-10-CM | POA: Diagnosis not present

## 2017-12-27 DIAGNOSIS — H5201 Hypermetropia, right eye: Secondary | ICD-10-CM | POA: Diagnosis not present

## 2017-12-27 DIAGNOSIS — Z961 Presence of intraocular lens: Secondary | ICD-10-CM | POA: Diagnosis not present

## 2018-01-17 ENCOUNTER — Telehealth: Payer: Self-pay | Admitting: Gastroenterology

## 2018-01-17 NOTE — Telephone Encounter (Signed)
Dr. Wynona Meals of the Day for 12/15/17 AM.   Patient is being referred to our office for a colonoscopy. Records placed on Dr. Woodward Ku desk for review.

## 2018-01-17 NOTE — Telephone Encounter (Signed)
Reviewed previous colonoscopy and pathology report from 2014 Moderate colonic diverticulosis, 89mm hyperplastic polyp removed from sigmoid and 2 mm adenomatous polyp removed from rectum.  Due for surveillance colonoscopy, okay to proceed with scheduling the procedure as direct with RN pre visit. Thanks

## 2018-01-18 ENCOUNTER — Encounter: Payer: Self-pay | Admitting: Gastroenterology

## 2018-01-18 NOTE — Telephone Encounter (Signed)
Colonoscopy scheduled.

## 2018-01-22 DIAGNOSIS — Z23 Encounter for immunization: Secondary | ICD-10-CM | POA: Diagnosis not present

## 2018-03-08 ENCOUNTER — Encounter: Payer: Medicare Other | Admitting: Gastroenterology

## 2018-03-08 DIAGNOSIS — K6389 Other specified diseases of intestine: Secondary | ICD-10-CM | POA: Diagnosis not present

## 2018-03-08 DIAGNOSIS — R197 Diarrhea, unspecified: Secondary | ICD-10-CM | POA: Diagnosis not present

## 2018-03-08 DIAGNOSIS — K648 Other hemorrhoids: Secondary | ICD-10-CM | POA: Diagnosis not present

## 2018-03-08 DIAGNOSIS — Z1211 Encounter for screening for malignant neoplasm of colon: Secondary | ICD-10-CM | POA: Diagnosis not present

## 2018-03-08 DIAGNOSIS — K5730 Diverticulosis of large intestine without perforation or abscess without bleeding: Secondary | ICD-10-CM | POA: Diagnosis not present

## 2018-03-08 DIAGNOSIS — Z86010 Personal history of colonic polyps: Secondary | ICD-10-CM | POA: Diagnosis not present

## 2018-03-09 DIAGNOSIS — Z6821 Body mass index (BMI) 21.0-21.9, adult: Secondary | ICD-10-CM | POA: Diagnosis not present

## 2018-03-09 DIAGNOSIS — Z23 Encounter for immunization: Secondary | ICD-10-CM | POA: Diagnosis not present

## 2018-03-09 DIAGNOSIS — Z789 Other specified health status: Secondary | ICD-10-CM | POA: Diagnosis not present

## 2018-03-09 DIAGNOSIS — Z719 Counseling, unspecified: Secondary | ICD-10-CM | POA: Diagnosis not present

## 2018-03-14 DIAGNOSIS — D485 Neoplasm of uncertain behavior of skin: Secondary | ICD-10-CM | POA: Diagnosis not present

## 2018-03-14 DIAGNOSIS — Z86018 Personal history of other benign neoplasm: Secondary | ICD-10-CM | POA: Diagnosis not present

## 2018-03-14 DIAGNOSIS — Z23 Encounter for immunization: Secondary | ICD-10-CM | POA: Diagnosis not present

## 2018-03-14 DIAGNOSIS — D2262 Melanocytic nevi of left upper limb, including shoulder: Secondary | ICD-10-CM | POA: Diagnosis not present

## 2018-03-14 DIAGNOSIS — D225 Melanocytic nevi of trunk: Secondary | ICD-10-CM | POA: Diagnosis not present

## 2018-03-14 DIAGNOSIS — D2271 Melanocytic nevi of right lower limb, including hip: Secondary | ICD-10-CM | POA: Diagnosis not present

## 2018-03-14 DIAGNOSIS — Z85828 Personal history of other malignant neoplasm of skin: Secondary | ICD-10-CM | POA: Diagnosis not present

## 2018-03-14 DIAGNOSIS — L570 Actinic keratosis: Secondary | ICD-10-CM | POA: Diagnosis not present

## 2018-03-28 ENCOUNTER — Other Ambulatory Visit: Payer: Self-pay

## 2018-04-16 DIAGNOSIS — E782 Mixed hyperlipidemia: Secondary | ICD-10-CM | POA: Diagnosis not present

## 2018-04-16 DIAGNOSIS — E559 Vitamin D deficiency, unspecified: Secondary | ICD-10-CM | POA: Diagnosis not present

## 2018-04-16 DIAGNOSIS — Z79899 Other long term (current) drug therapy: Secondary | ICD-10-CM | POA: Diagnosis not present

## 2018-04-20 DIAGNOSIS — Z13220 Encounter for screening for lipoid disorders: Secondary | ICD-10-CM | POA: Diagnosis not present

## 2018-04-20 DIAGNOSIS — Z6821 Body mass index (BMI) 21.0-21.9, adult: Secondary | ICD-10-CM | POA: Diagnosis not present

## 2018-04-20 DIAGNOSIS — R32 Unspecified urinary incontinence: Secondary | ICD-10-CM | POA: Diagnosis not present

## 2018-04-20 DIAGNOSIS — F331 Major depressive disorder, recurrent, moderate: Secondary | ICD-10-CM | POA: Diagnosis not present

## 2018-04-20 DIAGNOSIS — E559 Vitamin D deficiency, unspecified: Secondary | ICD-10-CM | POA: Diagnosis not present

## 2018-05-24 DIAGNOSIS — D225 Melanocytic nevi of trunk: Secondary | ICD-10-CM | POA: Diagnosis not present

## 2018-05-24 DIAGNOSIS — D485 Neoplasm of uncertain behavior of skin: Secondary | ICD-10-CM | POA: Diagnosis not present

## 2018-06-13 NOTE — Telephone Encounter (Signed)
Pt canceled her procedure.   Called and spoke with patient and she declined to reschedule. Informed her that her records would be disregarded and she agreed.

## 2018-09-12 DIAGNOSIS — D225 Melanocytic nevi of trunk: Secondary | ICD-10-CM | POA: Diagnosis not present

## 2018-09-12 DIAGNOSIS — Z86018 Personal history of other benign neoplasm: Secondary | ICD-10-CM | POA: Diagnosis not present

## 2018-09-12 DIAGNOSIS — D2262 Melanocytic nevi of left upper limb, including shoulder: Secondary | ICD-10-CM | POA: Diagnosis not present

## 2018-09-12 DIAGNOSIS — D2272 Melanocytic nevi of left lower limb, including hip: Secondary | ICD-10-CM | POA: Diagnosis not present

## 2018-09-12 DIAGNOSIS — D2271 Melanocytic nevi of right lower limb, including hip: Secondary | ICD-10-CM | POA: Diagnosis not present

## 2018-09-12 DIAGNOSIS — L821 Other seborrheic keratosis: Secondary | ICD-10-CM | POA: Diagnosis not present

## 2018-09-12 DIAGNOSIS — Z85828 Personal history of other malignant neoplasm of skin: Secondary | ICD-10-CM | POA: Diagnosis not present

## 2018-09-12 DIAGNOSIS — D485 Neoplasm of uncertain behavior of skin: Secondary | ICD-10-CM | POA: Diagnosis not present

## 2018-12-19 DIAGNOSIS — F331 Major depressive disorder, recurrent, moderate: Secondary | ICD-10-CM | POA: Diagnosis not present

## 2018-12-19 DIAGNOSIS — M810 Age-related osteoporosis without current pathological fracture: Secondary | ICD-10-CM | POA: Diagnosis not present

## 2018-12-19 DIAGNOSIS — F411 Generalized anxiety disorder: Secondary | ICD-10-CM | POA: Diagnosis not present

## 2018-12-19 DIAGNOSIS — E559 Vitamin D deficiency, unspecified: Secondary | ICD-10-CM | POA: Diagnosis not present

## 2018-12-19 DIAGNOSIS — Z0000 Encounter for general adult medical examination without abnormal findings: Secondary | ICD-10-CM | POA: Diagnosis not present

## 2019-03-14 DIAGNOSIS — Z79899 Other long term (current) drug therapy: Secondary | ICD-10-CM | POA: Diagnosis not present

## 2019-03-20 DIAGNOSIS — E559 Vitamin D deficiency, unspecified: Secondary | ICD-10-CM | POA: Diagnosis not present

## 2019-03-20 DIAGNOSIS — M170 Bilateral primary osteoarthritis of knee: Secondary | ICD-10-CM | POA: Diagnosis not present

## 2019-03-20 DIAGNOSIS — R32 Unspecified urinary incontinence: Secondary | ICD-10-CM | POA: Diagnosis not present

## 2019-04-03 ENCOUNTER — Other Ambulatory Visit: Payer: Self-pay

## 2019-05-27 DIAGNOSIS — M25559 Pain in unspecified hip: Secondary | ICD-10-CM | POA: Diagnosis not present

## 2019-06-03 DIAGNOSIS — M25559 Pain in unspecified hip: Secondary | ICD-10-CM | POA: Diagnosis not present

## 2019-06-12 DIAGNOSIS — M1611 Unilateral primary osteoarthritis, right hip: Secondary | ICD-10-CM | POA: Diagnosis not present

## 2019-06-15 ENCOUNTER — Ambulatory Visit: Payer: Medicare Other | Attending: Internal Medicine

## 2019-06-15 DIAGNOSIS — Z23 Encounter for immunization: Secondary | ICD-10-CM | POA: Insufficient documentation

## 2019-06-15 NOTE — Progress Notes (Signed)
   Covid-19 Vaccination Clinic  Name:  Julia Alvarez    MRN: [id number] DOB: [birth year]  06/15/2019  Julia Alvarez was observed post Covid-19 immunization for 30 minutes based on pre-vaccination screening without incidence. She was provided with Vaccine Information Sheet and instruction to access the V-Safe system.   Julia Alvarez was instructed to call 911 with any severe reactions post vaccine: Marland Kitchen Difficulty breathing  . Swelling of your face and throat  . A fast heartbeat  . A bad rash all over your body  . Dizziness and weakness    Immunizations Administered    Name Date Dose VIS Date Route   Pfizer COVID-19 Vaccine 06/15/2019  9:16 AM 0.3 mL 04/19/2019 Intramuscular   Manufacturer: [place]   Lot: [id number]   [place]: [id number]

## 2019-07-09 ENCOUNTER — Ambulatory Visit: Payer: Medicare Other | Attending: Internal Medicine

## 2019-07-09 ENCOUNTER — Ambulatory Visit: Payer: Medicare Other

## 2019-07-09 DIAGNOSIS — Z23 Encounter for immunization: Secondary | ICD-10-CM | POA: Insufficient documentation

## 2019-07-09 NOTE — Progress Notes (Signed)
   Covid-19 Vaccination Clinic  Name:  Julia Alvarez    MRN: [id number] DOB: 12/29/1945  07/09/2019  Ms. Hatem was observed post Covid-19 immunization for 30 minutes based on pre-vaccination screening without incident. She was provided with Vaccine Information Sheet and instruction to access the V-Safe system.   Ms. Ruden was instructed to call 911 with any severe reactions post vaccine: Marland Kitchen Difficulty breathing  . Swelling of face and throat  . A fast heartbeat  . A bad rash all over body  . Dizziness and weakness   Immunizations Administered    Name Date Dose VIS Date Route   Pfizer COVID-19 Vaccine 07/09/2019  1:22 PM 0.3 mL 04/19/2019 Intramuscular   Manufacturer: [place]   Lot: [id number]   [place]: [id number]

## 2019-07-26 ENCOUNTER — Other Ambulatory Visit: Payer: Self-pay | Admitting: Family Medicine

## 2019-07-26 DIAGNOSIS — N952 Postmenopausal atrophic vaginitis: Secondary | ICD-10-CM | POA: Diagnosis not present

## 2019-07-26 DIAGNOSIS — J309 Allergic rhinitis, unspecified: Secondary | ICD-10-CM | POA: Diagnosis not present

## 2019-07-26 DIAGNOSIS — M25559 Pain in unspecified hip: Secondary | ICD-10-CM | POA: Diagnosis not present

## 2019-07-26 DIAGNOSIS — R1031 Right lower quadrant pain: Secondary | ICD-10-CM

## 2019-08-19 ENCOUNTER — Ambulatory Visit
Admission: RE | Admit: 2019-08-19 | Discharge: 2019-08-19 | Disposition: A | Discharge status: Home / Self Care | Payer: Medicare Other | Source: Ambulatory Visit | Attending: Family Medicine | Admitting: Family Medicine

## 2019-08-19 ENCOUNTER — Other Ambulatory Visit: Payer: Self-pay

## 2019-08-19 DIAGNOSIS — M25551 Pain in right hip: Secondary | ICD-10-CM | POA: Diagnosis not present

## 2019-08-19 DIAGNOSIS — M1611 Unilateral primary osteoarthritis, right hip: Secondary | ICD-10-CM | POA: Diagnosis not present

## 2019-08-19 DIAGNOSIS — R1031 Right lower quadrant pain: Secondary | ICD-10-CM

## 2019-08-23 DIAGNOSIS — S76219D Strain of adductor muscle, fascia and tendon of unspecified thigh, subsequent encounter: Secondary | ICD-10-CM | POA: Diagnosis not present

## 2019-08-23 DIAGNOSIS — M169 Osteoarthritis of hip, unspecified: Secondary | ICD-10-CM | POA: Diagnosis not present

## 2019-08-23 DIAGNOSIS — Z719 Counseling, unspecified: Secondary | ICD-10-CM | POA: Diagnosis not present

## 2019-08-23 DIAGNOSIS — M25559 Pain in unspecified hip: Secondary | ICD-10-CM | POA: Diagnosis not present

## 2019-09-23 DIAGNOSIS — R531 Weakness: Secondary | ICD-10-CM | POA: Diagnosis not present

## 2019-09-23 DIAGNOSIS — R2689 Other abnormalities of gait and mobility: Secondary | ICD-10-CM | POA: Diagnosis not present

## 2019-09-23 DIAGNOSIS — M25551 Pain in right hip: Secondary | ICD-10-CM | POA: Diagnosis not present

## 2019-09-23 DIAGNOSIS — R2681 Unsteadiness on feet: Secondary | ICD-10-CM | POA: Diagnosis not present

## 2019-09-25 DIAGNOSIS — R2681 Unsteadiness on feet: Secondary | ICD-10-CM | POA: Diagnosis not present

## 2019-09-25 DIAGNOSIS — R2689 Other abnormalities of gait and mobility: Secondary | ICD-10-CM | POA: Diagnosis not present

## 2019-09-25 DIAGNOSIS — R531 Weakness: Secondary | ICD-10-CM | POA: Diagnosis not present

## 2019-09-25 DIAGNOSIS — M25551 Pain in right hip: Secondary | ICD-10-CM | POA: Diagnosis not present

## 2019-09-30 DIAGNOSIS — M25551 Pain in right hip: Secondary | ICD-10-CM | POA: Diagnosis not present

## 2019-09-30 DIAGNOSIS — R2689 Other abnormalities of gait and mobility: Secondary | ICD-10-CM | POA: Diagnosis not present

## 2019-09-30 DIAGNOSIS — R2681 Unsteadiness on feet: Secondary | ICD-10-CM | POA: Diagnosis not present

## 2019-09-30 DIAGNOSIS — R531 Weakness: Secondary | ICD-10-CM | POA: Diagnosis not present

## 2019-10-01 ENCOUNTER — Ambulatory Visit: Payer: Medicare Other | Admitting: Physical Therapy

## 2019-10-02 DIAGNOSIS — R531 Weakness: Secondary | ICD-10-CM | POA: Diagnosis not present

## 2019-10-02 DIAGNOSIS — R2681 Unsteadiness on feet: Secondary | ICD-10-CM | POA: Diagnosis not present

## 2019-10-02 DIAGNOSIS — M25551 Pain in right hip: Secondary | ICD-10-CM | POA: Diagnosis not present

## 2019-10-02 DIAGNOSIS — R2689 Other abnormalities of gait and mobility: Secondary | ICD-10-CM | POA: Diagnosis not present

## 2019-10-08 DIAGNOSIS — R2689 Other abnormalities of gait and mobility: Secondary | ICD-10-CM | POA: Diagnosis not present

## 2019-10-08 DIAGNOSIS — R2681 Unsteadiness on feet: Secondary | ICD-10-CM | POA: Diagnosis not present

## 2019-10-08 DIAGNOSIS — M25551 Pain in right hip: Secondary | ICD-10-CM | POA: Diagnosis not present

## 2019-10-08 DIAGNOSIS — R531 Weakness: Secondary | ICD-10-CM | POA: Diagnosis not present

## 2019-10-10 DIAGNOSIS — R531 Weakness: Secondary | ICD-10-CM | POA: Diagnosis not present

## 2019-10-10 DIAGNOSIS — R2689 Other abnormalities of gait and mobility: Secondary | ICD-10-CM | POA: Diagnosis not present

## 2019-10-10 DIAGNOSIS — M25551 Pain in right hip: Secondary | ICD-10-CM | POA: Diagnosis not present

## 2019-10-10 DIAGNOSIS — R2681 Unsteadiness on feet: Secondary | ICD-10-CM | POA: Diagnosis not present

## 2019-10-14 DIAGNOSIS — R2681 Unsteadiness on feet: Secondary | ICD-10-CM | POA: Diagnosis not present

## 2019-10-14 DIAGNOSIS — R2689 Other abnormalities of gait and mobility: Secondary | ICD-10-CM | POA: Diagnosis not present

## 2019-10-14 DIAGNOSIS — M25551 Pain in right hip: Secondary | ICD-10-CM | POA: Diagnosis not present

## 2019-10-14 DIAGNOSIS — R531 Weakness: Secondary | ICD-10-CM | POA: Diagnosis not present

## 2019-10-16 DIAGNOSIS — R531 Weakness: Secondary | ICD-10-CM | POA: Diagnosis not present

## 2019-10-16 DIAGNOSIS — M25551 Pain in right hip: Secondary | ICD-10-CM | POA: Diagnosis not present

## 2019-10-16 DIAGNOSIS — R2681 Unsteadiness on feet: Secondary | ICD-10-CM | POA: Diagnosis not present

## 2019-10-16 DIAGNOSIS — R2689 Other abnormalities of gait and mobility: Secondary | ICD-10-CM | POA: Diagnosis not present

## 2019-11-21 DIAGNOSIS — D1801 Hemangioma of skin and subcutaneous tissue: Secondary | ICD-10-CM | POA: Diagnosis not present

## 2019-11-21 DIAGNOSIS — D485 Neoplasm of uncertain behavior of skin: Secondary | ICD-10-CM | POA: Diagnosis not present

## 2019-11-21 DIAGNOSIS — L821 Other seborrheic keratosis: Secondary | ICD-10-CM | POA: Diagnosis not present

## 2019-11-21 DIAGNOSIS — L98499 Non-pressure chronic ulcer of skin of other sites with unspecified severity: Secondary | ICD-10-CM | POA: Diagnosis not present

## 2019-12-05 DIAGNOSIS — D485 Neoplasm of uncertain behavior of skin: Secondary | ICD-10-CM | POA: Diagnosis not present

## 2019-12-05 DIAGNOSIS — D225 Melanocytic nevi of trunk: Secondary | ICD-10-CM | POA: Diagnosis not present

## 2019-12-19 DIAGNOSIS — Z4802 Encounter for removal of sutures: Secondary | ICD-10-CM | POA: Diagnosis not present

## 2019-12-19 DIAGNOSIS — M79671 Pain in right foot: Secondary | ICD-10-CM | POA: Diagnosis not present

## 2019-12-20 DIAGNOSIS — C4491 Basal cell carcinoma of skin, unspecified: Secondary | ICD-10-CM | POA: Diagnosis not present

## 2019-12-20 DIAGNOSIS — Z1339 Encounter for screening examination for other mental health and behavioral disorders: Secondary | ICD-10-CM | POA: Diagnosis not present

## 2019-12-20 DIAGNOSIS — Z0000 Encounter for general adult medical examination without abnormal findings: Secondary | ICD-10-CM | POA: Diagnosis not present

## 2019-12-20 DIAGNOSIS — Z1331 Encounter for screening for depression: Secondary | ICD-10-CM | POA: Diagnosis not present

## 2019-12-24 DIAGNOSIS — Z20822 Contact with and (suspected) exposure to covid-19: Secondary | ICD-10-CM | POA: Diagnosis not present

## 2020-02-18 ENCOUNTER — Other Ambulatory Visit: Payer: Self-pay | Admitting: Family Medicine

## 2020-02-18 DIAGNOSIS — Z1231 Encounter for screening mammogram for malignant neoplasm of breast: Secondary | ICD-10-CM

## 2020-03-18 ENCOUNTER — Ambulatory Visit: Payer: Medicare Other

## 2020-03-18 ENCOUNTER — Other Ambulatory Visit: Payer: Self-pay | Admitting: Family Medicine

## 2020-03-18 ENCOUNTER — Ambulatory Visit
Admission: RE | Admit: 2020-03-18 | Discharge: 2020-03-18 | Disposition: A | Discharge status: Home / Self Care | Payer: Medicare Other | Source: Ambulatory Visit | Attending: Family Medicine | Admitting: Family Medicine

## 2020-03-18 ENCOUNTER — Other Ambulatory Visit: Payer: Self-pay

## 2020-03-18 DIAGNOSIS — Z1231 Encounter for screening mammogram for malignant neoplasm of breast: Secondary | ICD-10-CM | POA: Diagnosis not present

## 2020-04-07 DIAGNOSIS — N62 Hypertrophy of breast: Secondary | ICD-10-CM | POA: Diagnosis not present

## 2020-04-13 DIAGNOSIS — F411 Generalized anxiety disorder: Secondary | ICD-10-CM | POA: Diagnosis not present

## 2020-04-13 DIAGNOSIS — N952 Postmenopausal atrophic vaginitis: Secondary | ICD-10-CM | POA: Diagnosis not present

## 2020-04-13 DIAGNOSIS — R32 Unspecified urinary incontinence: Secondary | ICD-10-CM | POA: Diagnosis not present

## 2020-04-13 DIAGNOSIS — G4700 Insomnia, unspecified: Secondary | ICD-10-CM | POA: Diagnosis not present

## 2020-04-13 DIAGNOSIS — N393 Stress incontinence (female) (male): Secondary | ICD-10-CM | POA: Diagnosis not present

## 2020-04-13 DIAGNOSIS — F331 Major depressive disorder, recurrent, moderate: Secondary | ICD-10-CM | POA: Diagnosis not present

## 2020-06-12 DIAGNOSIS — M169 Osteoarthritis of hip, unspecified: Secondary | ICD-10-CM | POA: Diagnosis not present

## 2020-06-12 DIAGNOSIS — J309 Allergic rhinitis, unspecified: Secondary | ICD-10-CM | POA: Diagnosis not present

## 2020-06-12 DIAGNOSIS — E782 Mixed hyperlipidemia: Secondary | ICD-10-CM | POA: Diagnosis not present

## 2020-06-12 DIAGNOSIS — E559 Vitamin D deficiency, unspecified: Secondary | ICD-10-CM | POA: Diagnosis not present

## 2020-06-12 DIAGNOSIS — R5383 Other fatigue: Secondary | ICD-10-CM | POA: Diagnosis not present

## 2020-06-16 ENCOUNTER — Other Ambulatory Visit: Payer: Self-pay | Admitting: Family Medicine

## 2020-06-16 ENCOUNTER — Ambulatory Visit
Admission: RE | Admit: 2020-06-16 | Discharge: 2020-06-16 | Disposition: A | Discharge status: Home / Self Care | Payer: Medicare Other | Source: Ambulatory Visit | Attending: Family Medicine | Admitting: Family Medicine

## 2020-06-16 DIAGNOSIS — R0609 Other forms of dyspnea: Secondary | ICD-10-CM | POA: Diagnosis not present

## 2020-06-16 DIAGNOSIS — E782 Mixed hyperlipidemia: Secondary | ICD-10-CM | POA: Diagnosis not present

## 2020-06-16 DIAGNOSIS — R0602 Shortness of breath: Secondary | ICD-10-CM

## 2020-06-16 DIAGNOSIS — T8549XA Other mechanical complication of breast prosthesis and implant, initial encounter: Secondary | ICD-10-CM | POA: Diagnosis not present

## 2020-06-16 DIAGNOSIS — R0600 Dyspnea, unspecified: Secondary | ICD-10-CM

## 2020-06-18 DIAGNOSIS — R0600 Dyspnea, unspecified: Secondary | ICD-10-CM | POA: Diagnosis not present

## 2020-06-18 DIAGNOSIS — J309 Allergic rhinitis, unspecified: Secondary | ICD-10-CM | POA: Diagnosis not present

## 2020-06-18 DIAGNOSIS — E782 Mixed hyperlipidemia: Secondary | ICD-10-CM | POA: Diagnosis not present

## 2020-06-18 DIAGNOSIS — R32 Unspecified urinary incontinence: Secondary | ICD-10-CM | POA: Diagnosis not present

## 2020-06-18 DIAGNOSIS — E559 Vitamin D deficiency, unspecified: Secondary | ICD-10-CM | POA: Diagnosis not present

## 2020-06-19 ENCOUNTER — Telehealth: Payer: Self-pay

## 2020-06-19 NOTE — Telephone Encounter (Signed)
NOTES ON FILE FROM  DR Rachell Cipro [phone], SENT REFERRAL TO SCHEDULING

## 2020-06-26 NOTE — Progress Notes (Signed)
Cardiology Office Note:    Date:  06/29/2020   ID:  Julia Alvarez, DOB 03/14/1946, MRN [id number]  PCP:  Julia Alvarez, [place]  Cardiologist:  No primary care provider on file.  Advanced Practice Provider:  No care team member to display Electrophysiologist:  None   Referring MD: Julia Bien, MD    History of Present Illness:    Julia Alvarez is a 75 y.o. female with a hx of osteopenia who was referred by Dr. Ernie Hew for pre-operative evaluation prior to breast reduction surgery.  Patient states that she was feeling more SOB in January due to decreased exercise over the winter. Since that time, she has increased her exercise and is currently walking 4 miles a day and doing yoga 3x/week without issues. No chest pain, lightheadedness, SOB, or nausea. No orthopnea, PND, LE edema. No bleeding issues. Blood pressure is well controlled not on medications. Eats a Mediterranean diet.   Family history: no family history of heart disease.  TC 261, HDL 110, TG 67.   ECG 06/16/20: NSR with no ischemia or block.   Past Medical History:  Diagnosis Date  . Allergy   . Breast implant deflation   . Cataract    skin cancer, no melanoma  . Hyperlipemia, mixed   . Osteopenia   . SOB (shortness of breath) on exertion     Past Surgical History:  Procedure Laterality Date  . ABDOMINAL HYSTERECTOMY     complete  . AUGMENTATION MAMMAPLASTY Bilateral 05/09/2010  . BREAST BIOPSY    . HERNIA REPAIR     inguinal    Current Medications: Current Meds  Medication Sig  . Cholecalciferol (HM VITAMIN D3) 100 MCG (4000 UT) CAPS Take 2 capsules by mouth daily.  . diclofenac sodium (VOLTAREN) 1 % GEL Apply topically 4 (four) times daily.  . Estradiol 10 MCG TABS vaginal tablet Place 10 mcg vaginally 2 (two) times a week.  . mirabegron ER (MYRBETRIQ) 50 MG TB24 tablet Take 50 mg by mouth daily.  . Multiple Vitamin (MULTIVITAMIN WITH MINERALS) TABS tablet  Take 1 tablet by mouth daily.  Marland Kitchen OLANZapine-FLUoxetine (SYMBYAX) 3-25 MG capsule Take 1 capsule by mouth every evening.     Allergies:   Compazine [prochlorperazine]   Social History   Socioeconomic History  . Marital status: Married    Spouse name: Not on file  . Number of children: Not on file  . Years of education: Not on file  . Highest education level: Not on file  Occupational History  . Not on file  Tobacco Use  . Smoking status: Never Smoker  . Smokeless tobacco: Never Used  Substance and Sexual Activity  . Alcohol use: Not on file  . Drug use: Not on file  . Sexual activity: Not on file  Other Topics Concern  . Not on file  Social History Narrative  . Not on file   Social Determinants of Health   Financial Resource Strain: Not on file  Food Insecurity: Not on file  Transportation Needs: Not on file  Physical Activity: Not on file  Stress: Not on file  Social Connections: Not on file     Family History: The patient's family history includes Cancer in her brother, father, and sister; Hyperlipidemia in her brother and mother; Hypertension in her mother.  ROS:   Please see the history of present illness.    Review of Systems  Constitutional: Negative for chills and fever.  HENT: Negative for nosebleeds.   Eyes: Negative for blurred vision and redness.  Respiratory: Negative for shortness of breath.   Cardiovascular: Negative for chest pain, palpitations, orthopnea, claudication, leg swelling and PND.  Gastrointestinal: Negative for nausea and vomiting.  Genitourinary: Negative for dysuria.  Musculoskeletal: Negative for falls.  Neurological: Negative for dizziness and loss of consciousness.  Endo/Heme/Allergies: Negative for polydipsia.  Psychiatric/Behavioral: Negative for substance abuse.    EKGs/Labs/Other Studies Reviewed:    The following studies were reviewed today:  ECG 06/16/20: NSR with no ischemia or block.   Recent Labs: No results found  for requested labs within last 8760 hours.  Recent Lipid Panel No results found for: CHOL, TRIG, HDL, CHOLHDL, VLDL, LDLCALC, LDLDIRECT     Physical Exam:    VS:  BP 102/62   Pulse 60   Ht 5\' 4"  (1.626 m)   Wt 131 lb (59.4 kg)   SpO2 96%   BMI 22.49 kg/m     Wt Readings from Last 3 Encounters:  06/29/20 131 lb (59.4 kg)     GEN:  Well nourished, well developed in no acute distress HEENT: Normal NECK: No JVD; No carotid bruits CARDIAC: RRR, no murmurs, rubs, gallops RESPIRATORY:  Clear to auscultation without rales, wheezing or rhonchi  ABDOMEN: Soft, non-tender, non-distended MUSCULOSKELETAL:  No edema; No deformity  SKIN: Warm and dry NEUROLOGIC:  Alert and oriented x 3 PSYCHIATRIC:  Normal affect   ASSESSMENT:    1. Preoperative clearance   2. SOB (shortness of breath)   3. DOE (dyspnea on exertion)    PLAN:    In order of problems listed above:  #Pre-operative clearance prior to breast reduction surgery: #DOE Patient presents for pre-operative evaluation prior to breast reduction surgery with removal of breast implants. Was previously having episodes of SOB with exertion after not exercising in the beginning of winter, which have since resolved. She is now walking 4 miles per day and doing yoga 3x/week without issues. Specifically, no chest pain, current SOB, lightheadedness, palpitations, or nausea. No personal or family history of CAD and patient is healthy with no HTN, HLD or DMII. No further cardiac testing is needed prior to surgery as patient is able to perform >4 METs without issues. Revised cardiac index score is 0 with 3.9% 30 day risk of death, MI or cardiac arrest. She is low risk for a moderate risk procedure. -No further cardiac testing needed prior to surgical intervention as patient able to perform >4Mets without issues -She is low risk for a moderate risk procedure -Revised cardiac index score 0 which correlates with 3.9% 30d risk of death, MI or  cardiac arrest   Medication Adjustments/Labs and Tests Ordered: Current medicines are reviewed at length with the patient today.  Concerns regarding medicines are outlined above.  No orders of the defined types were placed in this encounter.  No orders of the defined types were placed in this encounter.   Patient Instructions  Medication Instructions:  Your physician recommends that you continue on your current medications as directed. Please refer to the Current Medication list given to you today.  *If you need a refill on your cardiac medications before your next appointment, please call your pharmacy*   Lab Work: None ordered  If you have labs (blood work) drawn today and your tests are completely normal, you will receive your results only by: Marland Kitchen MyChart Message (if you have MyChart) OR . A paper copy in the mail If you have any  lab test that is abnormal or we need to change your treatment, we will call you to review the results.   Testing/Procedures: None ordered   Follow-Up: At [facility], you and your health needs are our priority.  As part of our continuing mission to provide you with exceptional heart care, we have created designated Provider Care Teams.  These Care Teams include your primary Cardiologist (physician) and Advanced Practice Providers (APPs -  Physician Assistants and Nurse Practitioners) who all work together to provide you with the care you need, when you need it.  We recommend signing up for the patient portal called "MyChart".  Sign up information is provided on this After Visit Summary.  MyChart is used to connect with patients for Virtual Visits (Telemedicine).  Patients are able to view lab/test results, encounter notes, upcoming appointments, etc.  Non-urgent messages can be sent to your provider as well.   To learn more about what you can do with MyChart, go to [email or web address].    Your next appointment:   None   The format for your next  appointment:   In Person  Provider:   Gwyndolyn Kaufman, MD   Other Instructions      Signed, Freada Bergeron, MD  06/29/2020 8:55 AM    [place]

## 2020-06-29 ENCOUNTER — Encounter: Payer: Self-pay | Admitting: Cardiology

## 2020-06-29 ENCOUNTER — Other Ambulatory Visit: Payer: Self-pay

## 2020-06-29 ENCOUNTER — Ambulatory Visit (INDEPENDENT_AMBULATORY_CARE_PROVIDER_SITE_OTHER): Payer: Medicare Other | Admitting: Cardiology

## 2020-06-29 VITALS — BP 102/62 | HR 60 | Ht 64.0 in | Wt 131.0 lb

## 2020-06-29 DIAGNOSIS — R0600 Dyspnea, unspecified: Secondary | ICD-10-CM

## 2020-06-29 DIAGNOSIS — Z01818 Encounter for other preprocedural examination: Secondary | ICD-10-CM | POA: Diagnosis not present

## 2020-06-29 DIAGNOSIS — R0609 Other forms of dyspnea: Secondary | ICD-10-CM

## 2020-06-29 DIAGNOSIS — R0602 Shortness of breath: Secondary | ICD-10-CM

## 2020-06-29 NOTE — Patient Instructions (Signed)
Medication Instructions:  Your physician recommends that you continue on your current medications as directed. Please refer to the Current Medication list given to you today.  *If you need a refill on your cardiac medications before your next appointment, please call your pharmacy*   Lab Work: None ordered  If you have labs (blood work) drawn today and your tests are completely normal, you will receive your results only by: Marland Kitchen MyChart Message (if you have MyChart) OR . A paper copy in the mail If you have any lab test that is abnormal or we need to change your treatment, we will call you to review the results.   Testing/Procedures: None ordered   Follow-Up: At [facility], you and your health needs are our priority.  As part of our continuing mission to provide you with exceptional heart care, we have created designated Provider Care Teams.  These Care Teams include your primary Cardiologist (physician) and Advanced Practice Providers (APPs -  Physician Assistants and Nurse Practitioners) who all work together to provide you with the care you need, when you need it.  We recommend signing up for the patient portal called "MyChart".  Sign up information is provided on this After Visit Summary.  MyChart is used to connect with patients for Virtual Visits (Telemedicine).  Patients are able to view lab/test results, encounter notes, upcoming appointments, etc.  Non-urgent messages can be sent to your provider as well.   To learn more about what you can do with MyChart, go to [email or web address].    Your next appointment:   None   The format for your next appointment:   In Person  Provider:   Gwyndolyn Kaufman, MD   Other Instructions

## 2020-09-03 DIAGNOSIS — E782 Mixed hyperlipidemia: Secondary | ICD-10-CM | POA: Diagnosis not present

## 2020-09-07 DIAGNOSIS — R32 Unspecified urinary incontinence: Secondary | ICD-10-CM | POA: Diagnosis not present

## 2020-09-07 DIAGNOSIS — E782 Mixed hyperlipidemia: Secondary | ICD-10-CM | POA: Diagnosis not present

## 2020-09-07 DIAGNOSIS — F411 Generalized anxiety disorder: Secondary | ICD-10-CM | POA: Diagnosis not present

## 2020-09-07 DIAGNOSIS — G4700 Insomnia, unspecified: Secondary | ICD-10-CM | POA: Diagnosis not present

## 2020-09-07 DIAGNOSIS — N952 Postmenopausal atrophic vaginitis: Secondary | ICD-10-CM | POA: Diagnosis not present

## 2020-09-07 DIAGNOSIS — F331 Major depressive disorder, recurrent, moderate: Secondary | ICD-10-CM | POA: Diagnosis not present

## 2020-10-16 DIAGNOSIS — H02834 Dermatochalasis of left upper eyelid: Secondary | ICD-10-CM | POA: Diagnosis not present

## 2020-10-16 DIAGNOSIS — H5201 Hypermetropia, right eye: Secondary | ICD-10-CM | POA: Diagnosis not present

## 2020-10-16 DIAGNOSIS — H02831 Dermatochalasis of right upper eyelid: Secondary | ICD-10-CM | POA: Diagnosis not present

## 2020-10-16 DIAGNOSIS — Z961 Presence of intraocular lens: Secondary | ICD-10-CM | POA: Diagnosis not present

## 2020-12-09 DIAGNOSIS — Z23 Encounter for immunization: Secondary | ICD-10-CM | POA: Diagnosis not present

## 2020-12-21 DIAGNOSIS — Z0000 Encounter for general adult medical examination without abnormal findings: Secondary | ICD-10-CM | POA: Diagnosis not present

## 2020-12-21 DIAGNOSIS — Z1331 Encounter for screening for depression: Secondary | ICD-10-CM | POA: Diagnosis not present

## 2020-12-21 DIAGNOSIS — M8580 Other specified disorders of bone density and structure, unspecified site: Secondary | ICD-10-CM | POA: Diagnosis not present

## 2020-12-21 DIAGNOSIS — Z1339 Encounter for screening examination for other mental health and behavioral disorders: Secondary | ICD-10-CM | POA: Diagnosis not present

## 2020-12-23 ENCOUNTER — Other Ambulatory Visit: Payer: Self-pay | Admitting: Family Medicine

## 2020-12-23 DIAGNOSIS — M8580 Other specified disorders of bone density and structure, unspecified site: Secondary | ICD-10-CM

## 2021-01-08 ENCOUNTER — Other Ambulatory Visit: Payer: Medicare Other

## 2021-01-19 IMAGING — CR DG HIP (WITH OR WITHOUT PELVIS) 2-3V*R*
2 series · 2 of 2 positions shown · non-contrast
Comparison: 09/14/2017

CLINICAL DATA: Right groin pain and right hip pain 3 months. No
injury.

EXAM:
DG HIP (WITH OR WITHOUT PELVIS) 2-3V RIGHT

[t hip ap right (1 of 2)]
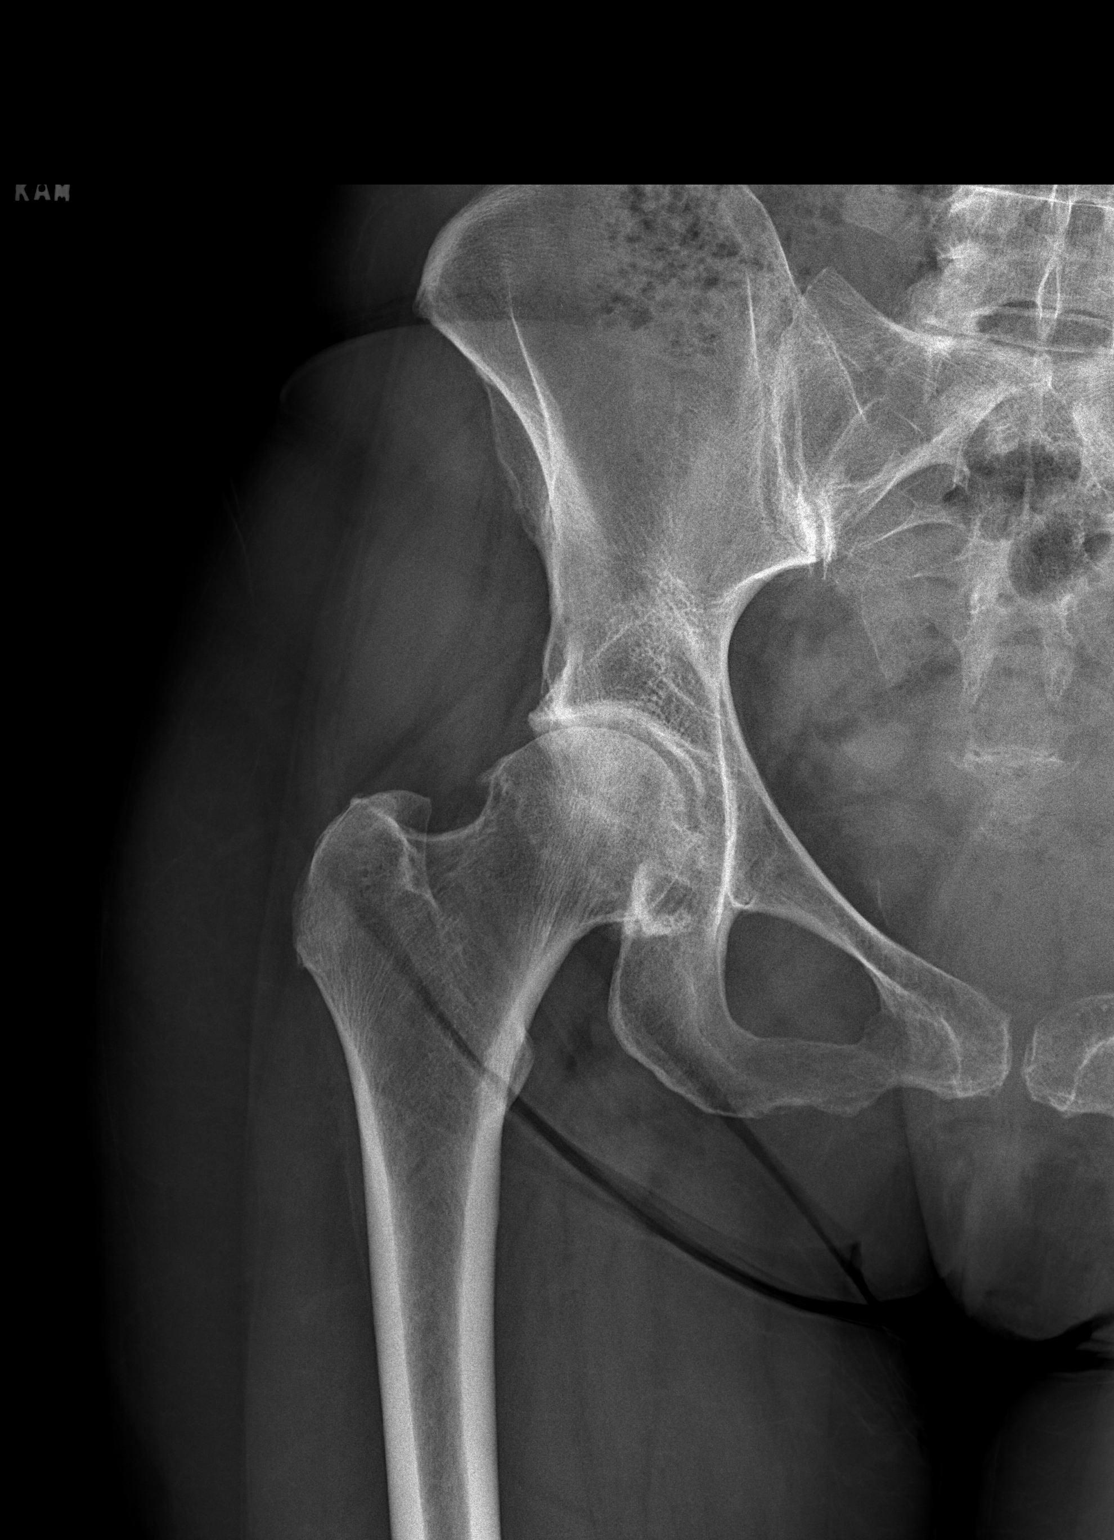

[t hip ap right (2 of 2)]
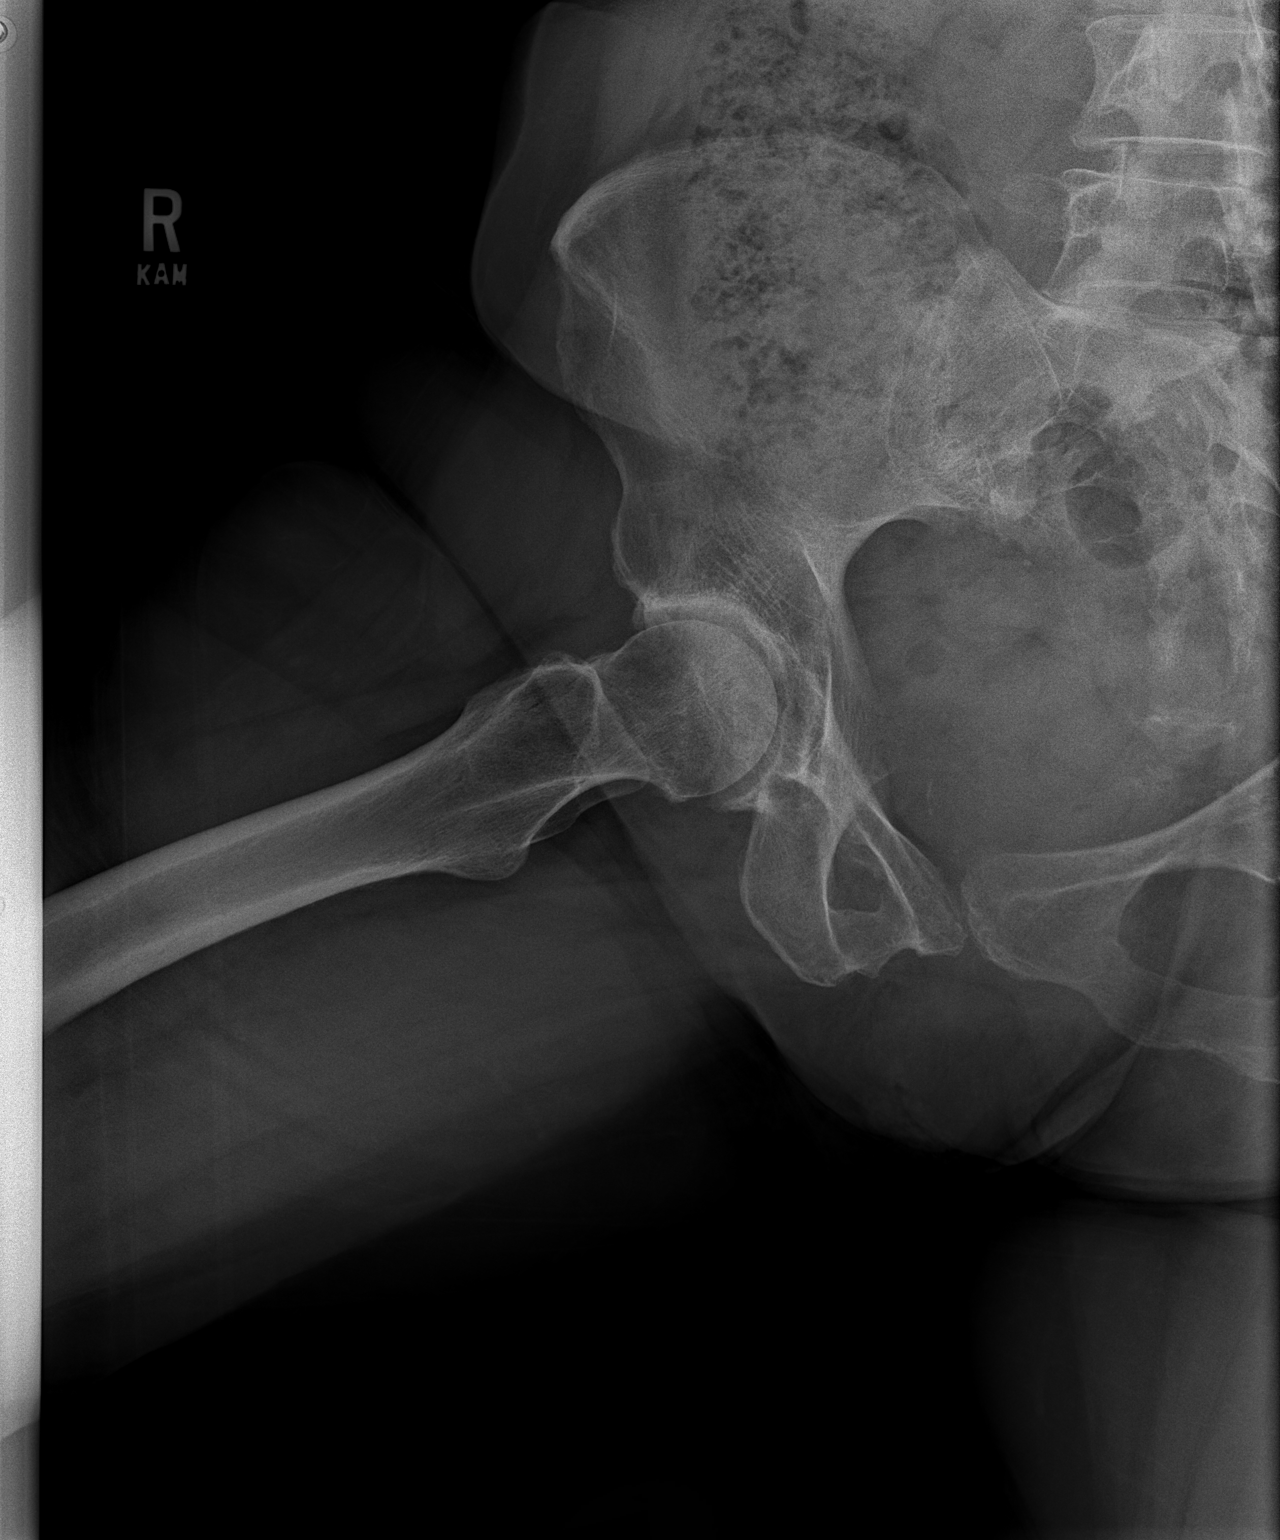

[2 of 2 positions shown; findings below may reference images not displayed]

FINDINGS: Mild osteoarthritic change of the right hip. No acute fracture or
dislocation. Mild degenerate change of the spine and right
sacroiliac joint.
IMPRESSION: No acute findings.

Mild osteoarthritic change of the right hip.

## 2021-02-16 ENCOUNTER — Other Ambulatory Visit: Payer: Self-pay | Admitting: Family Medicine

## 2021-02-16 DIAGNOSIS — Z1231 Encounter for screening mammogram for malignant neoplasm of breast: Secondary | ICD-10-CM

## 2021-03-15 DIAGNOSIS — D2239 Melanocytic nevi of other parts of face: Secondary | ICD-10-CM | POA: Diagnosis not present

## 2021-03-15 DIAGNOSIS — L111 Transient acantholytic dermatosis [Grover]: Secondary | ICD-10-CM | POA: Diagnosis not present

## 2021-03-15 DIAGNOSIS — D2271 Melanocytic nevi of right lower limb, including hip: Secondary | ICD-10-CM | POA: Diagnosis not present

## 2021-03-15 DIAGNOSIS — D1801 Hemangioma of skin and subcutaneous tissue: Secondary | ICD-10-CM | POA: Diagnosis not present

## 2021-03-15 DIAGNOSIS — L570 Actinic keratosis: Secondary | ICD-10-CM | POA: Diagnosis not present

## 2021-03-15 DIAGNOSIS — C44619 Basal cell carcinoma of skin of left upper limb, including shoulder: Secondary | ICD-10-CM | POA: Diagnosis not present

## 2021-03-15 DIAGNOSIS — D2261 Melanocytic nevi of right upper limb, including shoulder: Secondary | ICD-10-CM | POA: Diagnosis not present

## 2021-03-15 DIAGNOSIS — D2262 Melanocytic nevi of left upper limb, including shoulder: Secondary | ICD-10-CM | POA: Diagnosis not present

## 2021-03-15 DIAGNOSIS — L821 Other seborrheic keratosis: Secondary | ICD-10-CM | POA: Diagnosis not present

## 2021-03-15 DIAGNOSIS — L814 Other melanin hyperpigmentation: Secondary | ICD-10-CM | POA: Diagnosis not present

## 2021-03-15 DIAGNOSIS — D225 Melanocytic nevi of trunk: Secondary | ICD-10-CM | POA: Diagnosis not present

## 2021-03-15 DIAGNOSIS — D2272 Melanocytic nevi of left lower limb, including hip: Secondary | ICD-10-CM | POA: Diagnosis not present

## 2021-04-08 DIAGNOSIS — S52551A Other extraarticular fracture of lower end of right radius, initial encounter for closed fracture: Secondary | ICD-10-CM | POA: Diagnosis not present

## 2021-04-08 DIAGNOSIS — S52591A Other fractures of lower end of right radius, initial encounter for closed fracture: Secondary | ICD-10-CM | POA: Diagnosis not present

## 2021-04-12 DIAGNOSIS — Z7189 Other specified counseling: Secondary | ICD-10-CM | POA: Diagnosis not present

## 2021-04-12 DIAGNOSIS — M8580 Other specified disorders of bone density and structure, unspecified site: Secondary | ICD-10-CM | POA: Diagnosis not present

## 2021-04-12 DIAGNOSIS — G4700 Insomnia, unspecified: Secondary | ICD-10-CM | POA: Diagnosis not present

## 2021-04-12 DIAGNOSIS — N952 Postmenopausal atrophic vaginitis: Secondary | ICD-10-CM | POA: Diagnosis not present

## 2021-04-12 DIAGNOSIS — Z23 Encounter for immunization: Secondary | ICD-10-CM | POA: Diagnosis not present

## 2021-04-12 DIAGNOSIS — F411 Generalized anxiety disorder: Secondary | ICD-10-CM | POA: Diagnosis not present

## 2021-04-12 DIAGNOSIS — R32 Unspecified urinary incontinence: Secondary | ICD-10-CM | POA: Diagnosis not present

## 2021-04-12 DIAGNOSIS — F331 Major depressive disorder, recurrent, moderate: Secondary | ICD-10-CM | POA: Diagnosis not present

## 2021-04-12 DIAGNOSIS — E559 Vitamin D deficiency, unspecified: Secondary | ICD-10-CM | POA: Diagnosis not present

## 2021-04-12 DIAGNOSIS — S5291XA Unspecified fracture of right forearm, initial encounter for closed fracture: Secondary | ICD-10-CM | POA: Diagnosis not present

## 2021-04-13 ENCOUNTER — Other Ambulatory Visit: Payer: Self-pay

## 2021-04-13 ENCOUNTER — Other Ambulatory Visit: Payer: Self-pay | Admitting: Family Medicine

## 2021-04-13 ENCOUNTER — Ambulatory Visit
Admission: RE | Admit: 2021-04-13 | Discharge: 2021-04-13 | Disposition: A | Discharge status: Home / Self Care | Payer: Medicare Other | Source: Ambulatory Visit | Attending: Family Medicine | Admitting: Family Medicine

## 2021-04-13 DIAGNOSIS — Z1231 Encounter for screening mammogram for malignant neoplasm of breast: Secondary | ICD-10-CM

## 2021-04-21 DIAGNOSIS — S52501A Unspecified fracture of the lower end of right radius, initial encounter for closed fracture: Secondary | ICD-10-CM | POA: Diagnosis not present

## 2021-05-12 DIAGNOSIS — S52501D Unspecified fracture of the lower end of right radius, subsequent encounter for closed fracture with routine healing: Secondary | ICD-10-CM | POA: Diagnosis not present

## 2021-06-07 DIAGNOSIS — S52501D Unspecified fracture of the lower end of right radius, subsequent encounter for closed fracture with routine healing: Secondary | ICD-10-CM | POA: Diagnosis not present

## 2021-06-10 DIAGNOSIS — F411 Generalized anxiety disorder: Secondary | ICD-10-CM | POA: Diagnosis not present

## 2021-06-10 DIAGNOSIS — F331 Major depressive disorder, recurrent, moderate: Secondary | ICD-10-CM | POA: Diagnosis not present

## 2021-06-10 DIAGNOSIS — G4700 Insomnia, unspecified: Secondary | ICD-10-CM | POA: Diagnosis not present

## 2021-06-21 DIAGNOSIS — S52591S Other fractures of lower end of right radius, sequela: Secondary | ICD-10-CM | POA: Diagnosis not present

## 2021-06-21 DIAGNOSIS — M25531 Pain in right wrist: Secondary | ICD-10-CM | POA: Diagnosis not present

## 2021-06-21 DIAGNOSIS — M25631 Stiffness of right wrist, not elsewhere classified: Secondary | ICD-10-CM | POA: Diagnosis not present

## 2021-06-21 DIAGNOSIS — M25431 Effusion, right wrist: Secondary | ICD-10-CM | POA: Diagnosis not present

## 2021-06-21 DIAGNOSIS — M6281 Muscle weakness (generalized): Secondary | ICD-10-CM | POA: Diagnosis not present

## 2021-06-25 DIAGNOSIS — M25631 Stiffness of right wrist, not elsewhere classified: Secondary | ICD-10-CM | POA: Diagnosis not present

## 2021-06-25 DIAGNOSIS — M25431 Effusion, right wrist: Secondary | ICD-10-CM | POA: Diagnosis not present

## 2021-06-25 DIAGNOSIS — S52591S Other fractures of lower end of right radius, sequela: Secondary | ICD-10-CM | POA: Diagnosis not present

## 2021-06-25 DIAGNOSIS — M25531 Pain in right wrist: Secondary | ICD-10-CM | POA: Diagnosis not present

## 2021-06-25 DIAGNOSIS — M6281 Muscle weakness (generalized): Secondary | ICD-10-CM | POA: Diagnosis not present

## 2021-06-28 DIAGNOSIS — M25531 Pain in right wrist: Secondary | ICD-10-CM | POA: Diagnosis not present

## 2021-06-28 DIAGNOSIS — M25631 Stiffness of right wrist, not elsewhere classified: Secondary | ICD-10-CM | POA: Diagnosis not present

## 2021-06-28 DIAGNOSIS — S52591S Other fractures of lower end of right radius, sequela: Secondary | ICD-10-CM | POA: Diagnosis not present

## 2021-06-28 DIAGNOSIS — M25431 Effusion, right wrist: Secondary | ICD-10-CM | POA: Diagnosis not present

## 2021-06-28 DIAGNOSIS — M6281 Muscle weakness (generalized): Secondary | ICD-10-CM | POA: Diagnosis not present

## 2021-07-19 ENCOUNTER — Other Ambulatory Visit: Payer: Medicare Other

## 2021-07-27 DIAGNOSIS — M25431 Effusion, right wrist: Secondary | ICD-10-CM | POA: Diagnosis not present

## 2021-07-27 DIAGNOSIS — M6281 Muscle weakness (generalized): Secondary | ICD-10-CM | POA: Diagnosis not present

## 2021-07-27 DIAGNOSIS — S52591S Other fractures of lower end of right radius, sequela: Secondary | ICD-10-CM | POA: Diagnosis not present

## 2021-07-27 DIAGNOSIS — M25631 Stiffness of right wrist, not elsewhere classified: Secondary | ICD-10-CM | POA: Diagnosis not present

## 2021-07-27 DIAGNOSIS — M25531 Pain in right wrist: Secondary | ICD-10-CM | POA: Diagnosis not present

## 2021-07-30 DIAGNOSIS — M25431 Effusion, right wrist: Secondary | ICD-10-CM | POA: Diagnosis not present

## 2021-07-30 DIAGNOSIS — M6281 Muscle weakness (generalized): Secondary | ICD-10-CM | POA: Diagnosis not present

## 2021-07-30 DIAGNOSIS — M25631 Stiffness of right wrist, not elsewhere classified: Secondary | ICD-10-CM | POA: Diagnosis not present

## 2021-07-30 DIAGNOSIS — M25531 Pain in right wrist: Secondary | ICD-10-CM | POA: Diagnosis not present

## 2021-07-30 DIAGNOSIS — S52591S Other fractures of lower end of right radius, sequela: Secondary | ICD-10-CM | POA: Diagnosis not present

## 2021-08-02 DIAGNOSIS — E782 Mixed hyperlipidemia: Secondary | ICD-10-CM | POA: Diagnosis not present

## 2021-08-02 DIAGNOSIS — E559 Vitamin D deficiency, unspecified: Secondary | ICD-10-CM | POA: Diagnosis not present

## 2021-08-02 DIAGNOSIS — R5383 Other fatigue: Secondary | ICD-10-CM | POA: Diagnosis not present

## 2021-08-03 DIAGNOSIS — S52591S Other fractures of lower end of right radius, sequela: Secondary | ICD-10-CM | POA: Diagnosis not present

## 2021-08-03 DIAGNOSIS — M25431 Effusion, right wrist: Secondary | ICD-10-CM | POA: Diagnosis not present

## 2021-08-03 DIAGNOSIS — M25531 Pain in right wrist: Secondary | ICD-10-CM | POA: Diagnosis not present

## 2021-08-03 DIAGNOSIS — M25631 Stiffness of right wrist, not elsewhere classified: Secondary | ICD-10-CM | POA: Diagnosis not present

## 2021-08-03 DIAGNOSIS — M6281 Muscle weakness (generalized): Secondary | ICD-10-CM | POA: Diagnosis not present

## 2021-08-04 DIAGNOSIS — F411 Generalized anxiety disorder: Secondary | ICD-10-CM | POA: Diagnosis not present

## 2021-08-04 DIAGNOSIS — E559 Vitamin D deficiency, unspecified: Secondary | ICD-10-CM | POA: Diagnosis not present

## 2021-08-04 DIAGNOSIS — E782 Mixed hyperlipidemia: Secondary | ICD-10-CM | POA: Diagnosis not present

## 2021-08-04 DIAGNOSIS — N952 Postmenopausal atrophic vaginitis: Secondary | ICD-10-CM | POA: Diagnosis not present

## 2021-08-09 ENCOUNTER — Other Ambulatory Visit: Payer: Medicare Other

## 2021-08-09 DIAGNOSIS — S52591S Other fractures of lower end of right radius, sequela: Secondary | ICD-10-CM | POA: Diagnosis not present

## 2021-08-09 DIAGNOSIS — M25631 Stiffness of right wrist, not elsewhere classified: Secondary | ICD-10-CM | POA: Diagnosis not present

## 2021-08-09 DIAGNOSIS — M6281 Muscle weakness (generalized): Secondary | ICD-10-CM | POA: Diagnosis not present

## 2021-08-09 DIAGNOSIS — M25431 Effusion, right wrist: Secondary | ICD-10-CM | POA: Diagnosis not present

## 2021-08-09 DIAGNOSIS — M25531 Pain in right wrist: Secondary | ICD-10-CM | POA: Diagnosis not present

## 2021-08-20 ENCOUNTER — Other Ambulatory Visit: Payer: Medicare Other

## 2021-09-06 DIAGNOSIS — M25531 Pain in right wrist: Secondary | ICD-10-CM | POA: Diagnosis not present

## 2021-09-06 DIAGNOSIS — M25631 Stiffness of right wrist, not elsewhere classified: Secondary | ICD-10-CM | POA: Diagnosis not present

## 2021-09-06 DIAGNOSIS — S52591S Other fractures of lower end of right radius, sequela: Secondary | ICD-10-CM | POA: Diagnosis not present

## 2021-09-06 DIAGNOSIS — M25431 Effusion, right wrist: Secondary | ICD-10-CM | POA: Diagnosis not present

## 2021-09-06 DIAGNOSIS — M6281 Muscle weakness (generalized): Secondary | ICD-10-CM | POA: Diagnosis not present

## 2021-09-14 DIAGNOSIS — M25531 Pain in right wrist: Secondary | ICD-10-CM | POA: Diagnosis not present

## 2021-09-14 DIAGNOSIS — M25431 Effusion, right wrist: Secondary | ICD-10-CM | POA: Diagnosis not present

## 2021-09-14 DIAGNOSIS — M6281 Muscle weakness (generalized): Secondary | ICD-10-CM | POA: Diagnosis not present

## 2021-09-14 DIAGNOSIS — S52591S Other fractures of lower end of right radius, sequela: Secondary | ICD-10-CM | POA: Diagnosis not present

## 2021-09-14 DIAGNOSIS — M25631 Stiffness of right wrist, not elsewhere classified: Secondary | ICD-10-CM | POA: Diagnosis not present

## 2021-09-20 DIAGNOSIS — M25631 Stiffness of right wrist, not elsewhere classified: Secondary | ICD-10-CM | POA: Diagnosis not present

## 2021-09-20 DIAGNOSIS — M6281 Muscle weakness (generalized): Secondary | ICD-10-CM | POA: Diagnosis not present

## 2021-09-20 DIAGNOSIS — M25431 Effusion, right wrist: Secondary | ICD-10-CM | POA: Diagnosis not present

## 2021-09-20 DIAGNOSIS — S52591S Other fractures of lower end of right radius, sequela: Secondary | ICD-10-CM | POA: Diagnosis not present

## 2021-09-20 DIAGNOSIS — M25531 Pain in right wrist: Secondary | ICD-10-CM | POA: Diagnosis not present

## 2021-09-27 DIAGNOSIS — M25431 Effusion, right wrist: Secondary | ICD-10-CM | POA: Diagnosis not present

## 2021-09-27 DIAGNOSIS — M25531 Pain in right wrist: Secondary | ICD-10-CM | POA: Diagnosis not present

## 2021-09-27 DIAGNOSIS — M6281 Muscle weakness (generalized): Secondary | ICD-10-CM | POA: Diagnosis not present

## 2021-09-27 DIAGNOSIS — M25631 Stiffness of right wrist, not elsewhere classified: Secondary | ICD-10-CM | POA: Diagnosis not present

## 2021-09-27 DIAGNOSIS — S52591S Other fractures of lower end of right radius, sequela: Secondary | ICD-10-CM | POA: Diagnosis not present

## 2021-10-05 DIAGNOSIS — S52591S Other fractures of lower end of right radius, sequela: Secondary | ICD-10-CM | POA: Diagnosis not present

## 2021-10-05 DIAGNOSIS — M6281 Muscle weakness (generalized): Secondary | ICD-10-CM | POA: Diagnosis not present

## 2021-10-05 DIAGNOSIS — M25531 Pain in right wrist: Secondary | ICD-10-CM | POA: Diagnosis not present

## 2021-10-05 DIAGNOSIS — M25431 Effusion, right wrist: Secondary | ICD-10-CM | POA: Diagnosis not present

## 2021-10-05 DIAGNOSIS — M25631 Stiffness of right wrist, not elsewhere classified: Secondary | ICD-10-CM | POA: Diagnosis not present

## 2021-11-12 DIAGNOSIS — H2511 Age-related nuclear cataract, right eye: Secondary | ICD-10-CM | POA: Diagnosis not present

## 2021-11-12 DIAGNOSIS — H25011 Cortical age-related cataract, right eye: Secondary | ICD-10-CM | POA: Diagnosis not present

## 2021-11-12 DIAGNOSIS — H5201 Hypermetropia, right eye: Secondary | ICD-10-CM | POA: Diagnosis not present

## 2021-12-21 DIAGNOSIS — H25811 Combined forms of age-related cataract, right eye: Secondary | ICD-10-CM | POA: Diagnosis not present

## 2021-12-21 DIAGNOSIS — H269 Unspecified cataract: Secondary | ICD-10-CM | POA: Diagnosis not present

## 2021-12-21 DIAGNOSIS — H2511 Age-related nuclear cataract, right eye: Secondary | ICD-10-CM | POA: Diagnosis not present

## 2021-12-21 DIAGNOSIS — H25011 Cortical age-related cataract, right eye: Secondary | ICD-10-CM | POA: Diagnosis not present

## 2021-12-30 ENCOUNTER — Other Ambulatory Visit: Payer: Self-pay | Admitting: Family Medicine

## 2021-12-30 DIAGNOSIS — Z1331 Encounter for screening for depression: Secondary | ICD-10-CM | POA: Diagnosis not present

## 2021-12-30 DIAGNOSIS — Z1339 Encounter for screening examination for other mental health and behavioral disorders: Secondary | ICD-10-CM | POA: Diagnosis not present

## 2021-12-30 DIAGNOSIS — Z0000 Encounter for general adult medical examination without abnormal findings: Secondary | ICD-10-CM | POA: Diagnosis not present

## 2021-12-30 DIAGNOSIS — Z23 Encounter for immunization: Secondary | ICD-10-CM | POA: Diagnosis not present

## 2021-12-30 DIAGNOSIS — M8580 Other specified disorders of bone density and structure, unspecified site: Secondary | ICD-10-CM

## 2021-12-30 DIAGNOSIS — Z7189 Other specified counseling: Secondary | ICD-10-CM | POA: Diagnosis not present

## 2021-12-30 DIAGNOSIS — Z1231 Encounter for screening mammogram for malignant neoplasm of breast: Secondary | ICD-10-CM

## 2022-01-03 ENCOUNTER — Ambulatory Visit (INDEPENDENT_AMBULATORY_CARE_PROVIDER_SITE_OTHER): Payer: Medicare Other | Admitting: Podiatry

## 2022-01-03 DIAGNOSIS — L600 Ingrowing nail: Secondary | ICD-10-CM | POA: Diagnosis not present

## 2022-01-03 NOTE — Progress Notes (Signed)
  Subjective:  Patient ID: Julia Alvarez, female    DOB: Mar 18, 1946,  MRN: [id number]  Chief Complaint  Patient presents with   Foot Problem    Hostetter    76 y.o. female presents with the above complaint. History confirmed with patient.  She has difficulty cutting the nails the corners are ingrowing and causing problems  Objective:  Physical Exam: warm, good capillary refill, no trophic changes or ulcerative lesions, normal DP and PT pulses, normal sensory exam, and incurvated ingrowing nails.  Assessment:   1. Ingrowing left great toenail   2. Ingrowing right great toenail      Plan:  Patient was evaluated and treated and all questions answered.  Debrided the nails in a slant back fashion to a tolerable level utilizing a sharp nail nipper.  Do not think partial or total matricectomy is necessary at this point.  Return in about 3 months (around 04/05/2022) for RFC.

## 2022-01-31 DIAGNOSIS — Z23 Encounter for immunization: Secondary | ICD-10-CM | POA: Diagnosis not present

## 2022-02-03 DIAGNOSIS — R197 Diarrhea, unspecified: Secondary | ICD-10-CM | POA: Diagnosis not present

## 2022-02-03 DIAGNOSIS — R030 Elevated blood-pressure reading, without diagnosis of hypertension: Secondary | ICD-10-CM | POA: Diagnosis not present

## 2022-03-22 DIAGNOSIS — Z85828 Personal history of other malignant neoplasm of skin: Secondary | ICD-10-CM | POA: Diagnosis not present

## 2022-03-22 DIAGNOSIS — L821 Other seborrheic keratosis: Secondary | ICD-10-CM | POA: Diagnosis not present

## 2022-03-22 DIAGNOSIS — D2272 Melanocytic nevi of left lower limb, including hip: Secondary | ICD-10-CM | POA: Diagnosis not present

## 2022-03-22 DIAGNOSIS — D2271 Melanocytic nevi of right lower limb, including hip: Secondary | ICD-10-CM | POA: Diagnosis not present

## 2022-03-22 DIAGNOSIS — L814 Other melanin hyperpigmentation: Secondary | ICD-10-CM | POA: Diagnosis not present

## 2022-03-22 DIAGNOSIS — D1801 Hemangioma of skin and subcutaneous tissue: Secondary | ICD-10-CM | POA: Diagnosis not present

## 2022-03-22 DIAGNOSIS — D225 Melanocytic nevi of trunk: Secondary | ICD-10-CM | POA: Diagnosis not present

## 2022-03-22 DIAGNOSIS — C44612 Basal cell carcinoma of skin of right upper limb, including shoulder: Secondary | ICD-10-CM | POA: Diagnosis not present

## 2022-03-22 DIAGNOSIS — D2261 Melanocytic nevi of right upper limb, including shoulder: Secondary | ICD-10-CM | POA: Diagnosis not present

## 2022-03-22 DIAGNOSIS — D2262 Melanocytic nevi of left upper limb, including shoulder: Secondary | ICD-10-CM | POA: Diagnosis not present

## 2022-04-05 ENCOUNTER — Ambulatory Visit: Payer: Medicare Other | Admitting: Podiatry

## 2022-04-20 DIAGNOSIS — E782 Mixed hyperlipidemia: Secondary | ICD-10-CM | POA: Diagnosis not present

## 2022-04-27 DIAGNOSIS — F411 Generalized anxiety disorder: Secondary | ICD-10-CM | POA: Diagnosis not present

## 2022-04-27 DIAGNOSIS — N952 Postmenopausal atrophic vaginitis: Secondary | ICD-10-CM | POA: Diagnosis not present

## 2022-04-27 DIAGNOSIS — E782 Mixed hyperlipidemia: Secondary | ICD-10-CM | POA: Diagnosis not present

## 2022-04-27 DIAGNOSIS — N393 Stress incontinence (female) (male): Secondary | ICD-10-CM | POA: Diagnosis not present

## 2022-04-27 DIAGNOSIS — G4700 Insomnia, unspecified: Secondary | ICD-10-CM | POA: Diagnosis not present

## 2022-04-27 DIAGNOSIS — F331 Major depressive disorder, recurrent, moderate: Secondary | ICD-10-CM | POA: Diagnosis not present

## 2022-06-27 ENCOUNTER — Ambulatory Visit
Admission: RE | Admit: 2022-06-27 | Discharge: 2022-06-27 | Disposition: A | Discharge status: Home / Self Care | Payer: Medicare Other | Source: Ambulatory Visit | Attending: Family Medicine | Admitting: Family Medicine

## 2022-06-27 DIAGNOSIS — Z780 Asymptomatic menopausal state: Secondary | ICD-10-CM | POA: Diagnosis not present

## 2022-06-27 DIAGNOSIS — Z1231 Encounter for screening mammogram for malignant neoplasm of breast: Secondary | ICD-10-CM | POA: Diagnosis not present

## 2022-06-27 DIAGNOSIS — M85852 Other specified disorders of bone density and structure, left thigh: Secondary | ICD-10-CM | POA: Diagnosis not present

## 2022-06-27 DIAGNOSIS — M8580 Other specified disorders of bone density and structure, unspecified site: Secondary | ICD-10-CM

## 2022-06-30 ENCOUNTER — Other Ambulatory Visit: Payer: Self-pay | Admitting: Family Medicine

## 2022-06-30 DIAGNOSIS — R928 Other abnormal and inconclusive findings on diagnostic imaging of breast: Secondary | ICD-10-CM

## 2022-07-01 DIAGNOSIS — M8580 Other specified disorders of bone density and structure, unspecified site: Secondary | ICD-10-CM | POA: Diagnosis not present

## 2022-07-12 ENCOUNTER — Ambulatory Visit
Admission: RE | Admit: 2022-07-12 | Discharge: 2022-07-12 | Disposition: A | Discharge status: Home / Self Care | Payer: Medicare Other | Source: Ambulatory Visit | Attending: Family Medicine | Admitting: Family Medicine

## 2022-07-12 ENCOUNTER — Other Ambulatory Visit: Payer: Self-pay | Admitting: Family Medicine

## 2022-07-12 DIAGNOSIS — R928 Other abnormal and inconclusive findings on diagnostic imaging of breast: Secondary | ICD-10-CM

## 2022-07-12 DIAGNOSIS — N6489 Other specified disorders of breast: Secondary | ICD-10-CM

## 2022-07-22 ENCOUNTER — Ambulatory Visit
Admission: RE | Admit: 2022-07-22 | Discharge: 2022-07-22 | Disposition: A | Discharge status: Home / Self Care | Payer: Medicare Other | Source: Ambulatory Visit | Attending: Family Medicine | Admitting: Family Medicine

## 2022-07-22 DIAGNOSIS — N6011 Diffuse cystic mastopathy of right breast: Secondary | ICD-10-CM | POA: Diagnosis not present

## 2022-07-22 DIAGNOSIS — R928 Other abnormal and inconclusive findings on diagnostic imaging of breast: Secondary | ICD-10-CM | POA: Diagnosis not present

## 2022-07-22 DIAGNOSIS — N6489 Other specified disorders of breast: Secondary | ICD-10-CM

## 2022-07-22 HISTORY — PX: BREAST BIOPSY: SHX20

## 2022-09-14 IMAGING — MG MM DIGITAL SCREENING BILAT W/ TOMO AND CAD
8 series · 9 of 24 positions shown · non-contrast
Comparison: Previous exam(s).

CLINICAL DATA: Screening.

EXAM:
DIGITAL SCREENING BILATERAL MAMMOGRAM WITH TOMOSYNTHESIS AND CAD
TECHNIQUE: Bilateral screening digital craniocaudal and mediolateral oblique
mammograms were obtained. Bilateral screening digital breast
tomosynthesis was performed. The images were evaluated with
computer-aided detection.

[R MLO synth-2D]
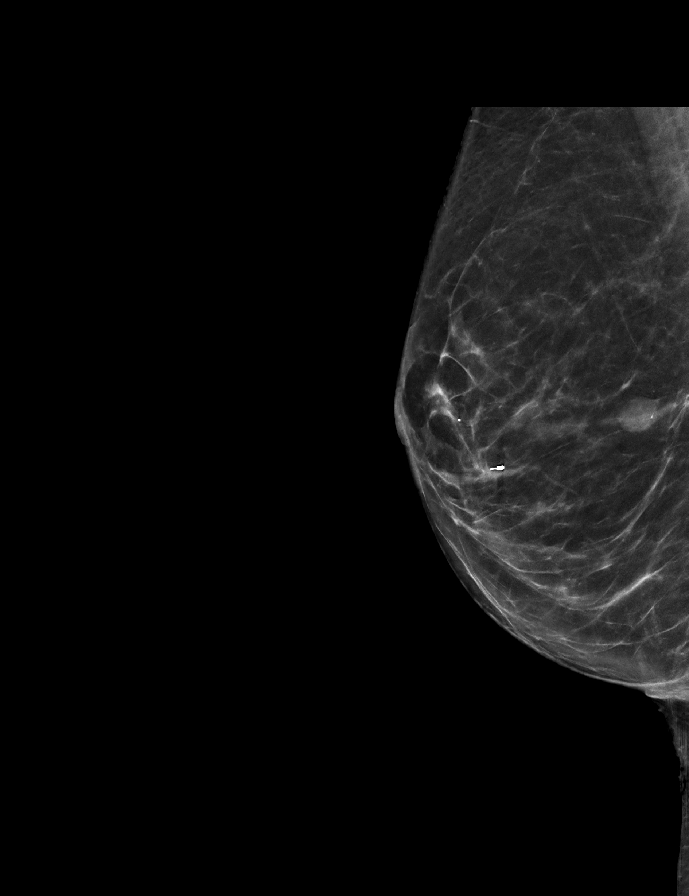

[L CC synth-2D]
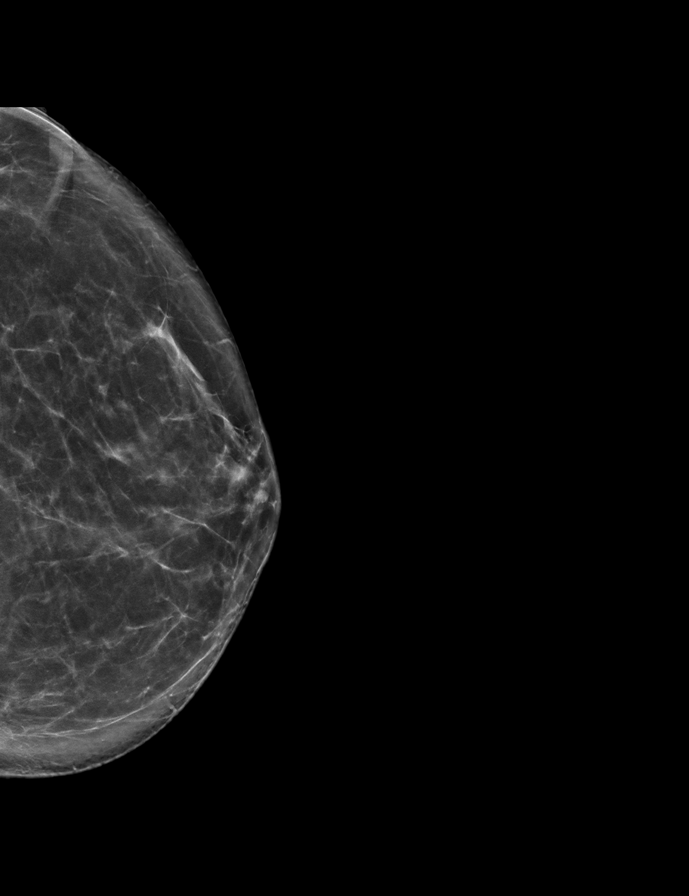

[R CC synth-2D]
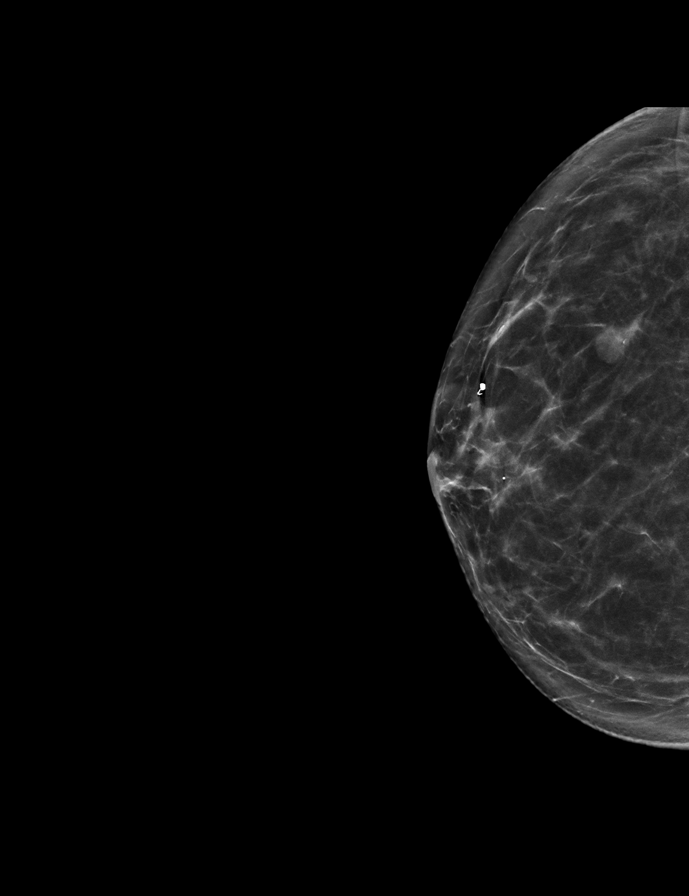

[L MLO synth-2D]
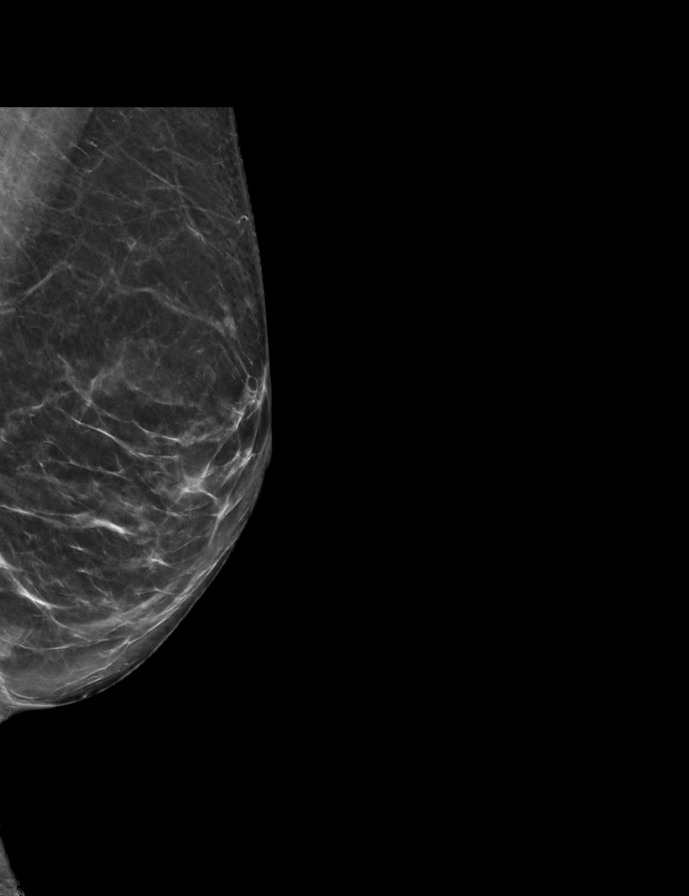

[L MLO tomo · 2 of 68 frames shown]
[frame 22/68]
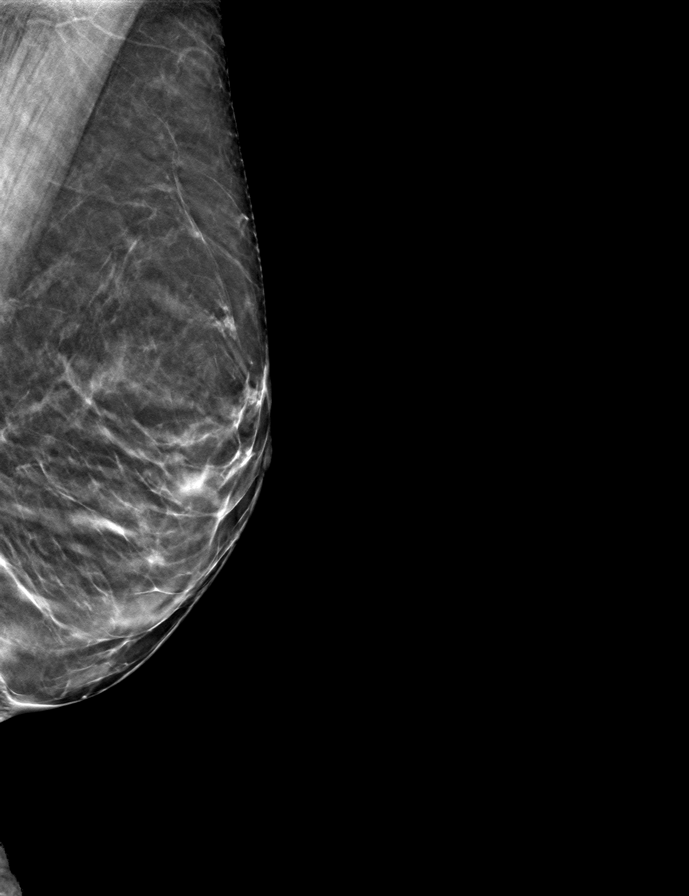
[frame 35/68]
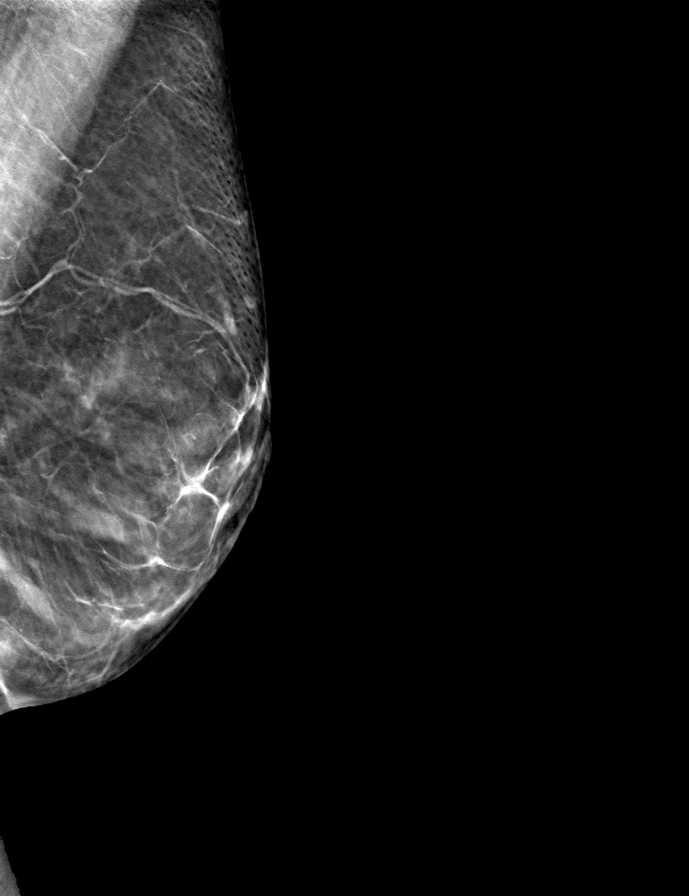

[R MLO tomo · tomo slice 35/68.0]
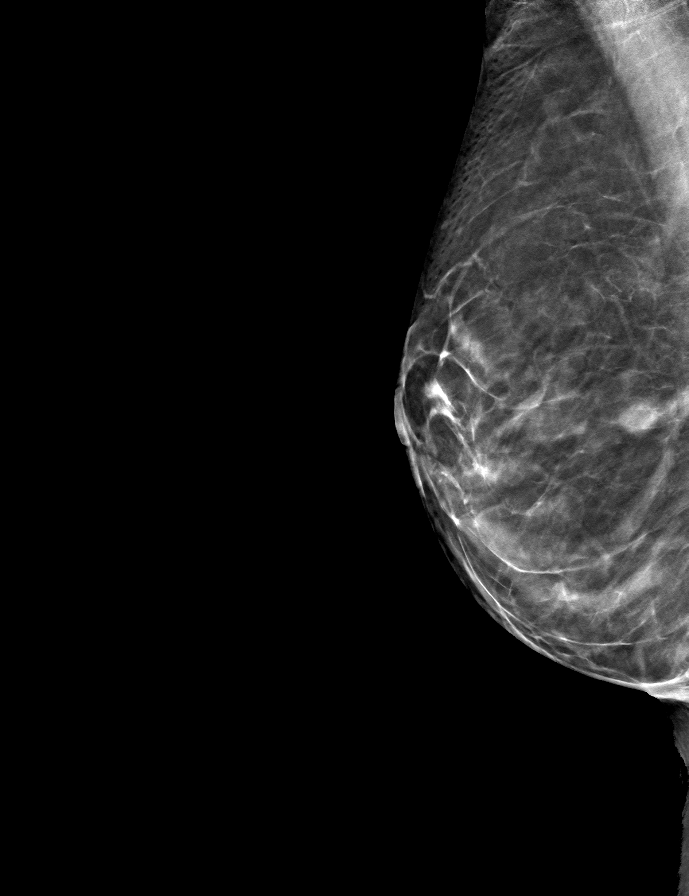

[L CC tomo · tomo slice 34/67.0]
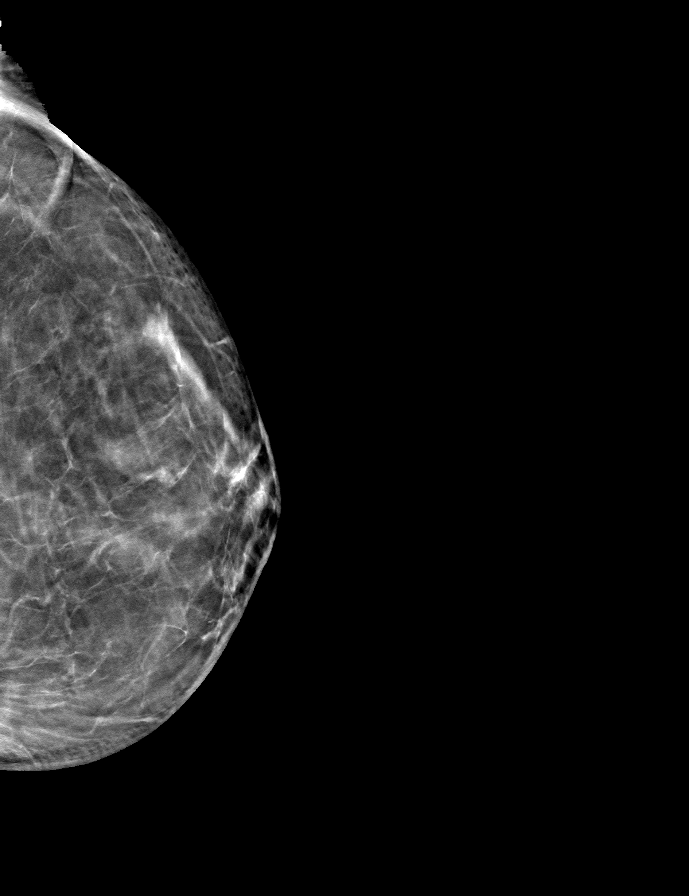

[R CC tomo · tomo slice 29/58.0]
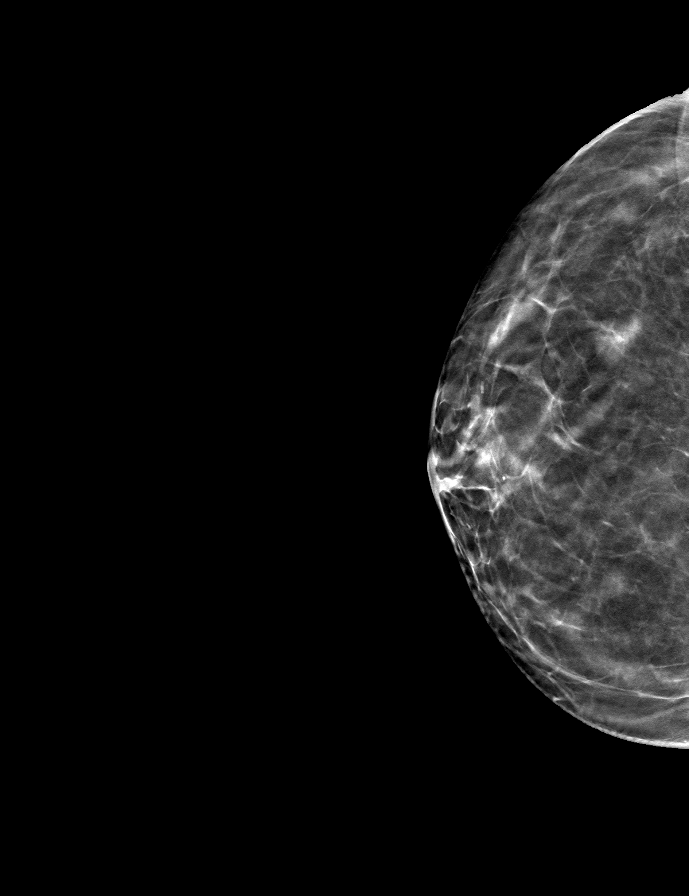

[9 of 24 positions shown; findings below may reference images not displayed]

ACR Breast Density Category b: There are scattered areas of
fibroglandular density.
FINDINGS: There are no findings suspicious for malignancy.
IMPRESSION: No mammographic evidence of malignancy. A result letter of this
screening mammogram will be mailed directly to the patient.

RECOMMENDATION:
Screening mammogram in one year. (Code:51-O-LD2)

BI-RADS CATEGORY  1: Negative.

## 2022-10-04 ENCOUNTER — Ambulatory Visit (INDEPENDENT_AMBULATORY_CARE_PROVIDER_SITE_OTHER): Payer: Medicare Other | Admitting: Podiatry

## 2022-10-04 DIAGNOSIS — M79674 Pain in right toe(s): Secondary | ICD-10-CM

## 2022-10-04 DIAGNOSIS — M79675 Pain in left toe(s): Secondary | ICD-10-CM

## 2022-10-04 DIAGNOSIS — L600 Ingrowing nail: Secondary | ICD-10-CM

## 2022-10-04 DIAGNOSIS — B351 Tinea unguium: Secondary | ICD-10-CM

## 2022-10-04 NOTE — Progress Notes (Signed)
  Subjective:  Patient ID: Julia Alvarez, female    DOB: 03/28/1946,  MRN: [id number]  Chief Complaint  Patient presents with   Nail Problem    Thick painful toenails    77 y.o. female presents with the above complaint. History confirmed with patient.  Overall doing well the nails have become thickened elongated again and causing discomfort especially in the corners  Objective:  Physical Exam: warm, good capillary refill, no trophic changes or ulcerative lesions, normal DP and PT pulses, normal sensory exam, and incurvated ingrowing nails, multiple nails with thickening discoloration and dystrophy with subungual debris.  Assessment:   1. Ingrowing left great toenail   2. Ingrowing right great toenail      Plan:  Patient was evaluated and treated and all questions answered.  Discussed the etiology and treatment options for the condition in detail with the patient.  Prior nail debridement was helpful.  Recommended debridement of the nails today. Sharp and mechanical debridement performed of all painful and mycotic nails today. Nails debrided in length and thickness using a nail nipper to level of comfort. Discussed treatment options including appropriate shoe gear. Follow up as needed for painful nails.  Will consider partial matricectomy  If continuing to worsen    Return if symptoms worsen or fail to improve.

## 2022-10-19 DIAGNOSIS — E559 Vitamin D deficiency, unspecified: Secondary | ICD-10-CM | POA: Diagnosis not present

## 2022-10-19 DIAGNOSIS — E039 Hypothyroidism, unspecified: Secondary | ICD-10-CM | POA: Diagnosis not present

## 2022-10-25 DIAGNOSIS — F331 Major depressive disorder, recurrent, moderate: Secondary | ICD-10-CM | POA: Diagnosis not present

## 2022-10-25 DIAGNOSIS — E559 Vitamin D deficiency, unspecified: Secondary | ICD-10-CM | POA: Diagnosis not present

## 2022-10-25 DIAGNOSIS — F411 Generalized anxiety disorder: Secondary | ICD-10-CM | POA: Diagnosis not present

## 2022-10-25 DIAGNOSIS — G4700 Insomnia, unspecified: Secondary | ICD-10-CM | POA: Diagnosis not present

## 2023-01-02 DIAGNOSIS — Z0000 Encounter for general adult medical examination without abnormal findings: Secondary | ICD-10-CM | POA: Diagnosis not present

## 2023-01-02 DIAGNOSIS — Z1339 Encounter for screening examination for other mental health and behavioral disorders: Secondary | ICD-10-CM | POA: Diagnosis not present

## 2023-01-02 DIAGNOSIS — Z1331 Encounter for screening for depression: Secondary | ICD-10-CM | POA: Diagnosis not present

## 2023-01-02 DIAGNOSIS — Z23 Encounter for immunization: Secondary | ICD-10-CM | POA: Diagnosis not present

## 2023-01-05 ENCOUNTER — Ambulatory Visit (INDEPENDENT_AMBULATORY_CARE_PROVIDER_SITE_OTHER): Payer: Medicare Other | Admitting: Podiatry

## 2023-01-05 DIAGNOSIS — L84 Corns and callosities: Secondary | ICD-10-CM

## 2023-01-05 DIAGNOSIS — M2041 Other hammer toe(s) (acquired), right foot: Secondary | ICD-10-CM | POA: Diagnosis not present

## 2023-01-05 DIAGNOSIS — L600 Ingrowing nail: Secondary | ICD-10-CM | POA: Diagnosis not present

## 2023-01-05 DIAGNOSIS — B351 Tinea unguium: Secondary | ICD-10-CM

## 2023-01-05 NOTE — Patient Instructions (Signed)
More silicone pads can be purchased from:  [email or web address]  

## 2023-01-05 NOTE — Progress Notes (Signed)
  Subjective:  Patient ID: Julia Alvarez, female    DOB: [birth year],  MRN: [id number]  Chief Complaint  Patient presents with   Nail Problem   Ingrown Toenail    77 y.o. female presents with the above complaint. History confirmed with patient.  Overall doing well the nails have become thickened elongated again and causing discomfort especially in the corners, regular debridement has been helpful in reducing pain and improving function.  She has a new issue with pain between the fourth and fifth toes on the right foot  Objective:  Physical Exam: warm, good capillary refill, no trophic changes or ulcerative lesions, normal DP and PT pulses, normal sensory exam, and incurvated ingrowing nails, multiple nails with thickening discoloration and dystrophy with subungual debris.  No paronychia is noted.  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 Ingrowing left great toenail   2. Ingrowing right great toenail   3. Pain due to onychomycosis of toenails of both feet      Plan:  Patient was evaluated and treated and all questions answered.  Discussed the etiology and treatment options for the condition in detail with the patient.  Prior nail debridement was helpful.  Recommended debridement of the nails today. Sharp and mechanical debridement performed of all painful and mycotic nails today. Nails debrided in length and thickness using a nail nipper to level of comfort. Discussed treatment options including appropriate shoe gear. Follow up as needed for painful nails.  Will consider partial matricectomy  If continuing to worsen  We discussed development of a whole mole in the fourth interspace this was mild and did not require debridement today.  I did recommend utilizing an offloading silicone pad to take pressure off of the phalangeal head to reduce pain and improve function.  Follow-up as needed if  this returns or worsens.  Return in about 3 months (around 04/07/2023) for RFC.

## 2023-01-12 DIAGNOSIS — Z23 Encounter for immunization: Secondary | ICD-10-CM | POA: Diagnosis not present

## 2023-02-16 DIAGNOSIS — Z961 Presence of intraocular lens: Secondary | ICD-10-CM | POA: Diagnosis not present

## 2023-02-20 DIAGNOSIS — D2271 Melanocytic nevi of right lower limb, including hip: Secondary | ICD-10-CM | POA: Diagnosis not present

## 2023-02-20 DIAGNOSIS — L820 Inflamed seborrheic keratosis: Secondary | ICD-10-CM | POA: Diagnosis not present

## 2023-02-20 DIAGNOSIS — D2272 Melanocytic nevi of left lower limb, including hip: Secondary | ICD-10-CM | POA: Diagnosis not present

## 2023-02-20 DIAGNOSIS — D2261 Melanocytic nevi of right upper limb, including shoulder: Secondary | ICD-10-CM | POA: Diagnosis not present

## 2023-02-20 DIAGNOSIS — L814 Other melanin hyperpigmentation: Secondary | ICD-10-CM | POA: Diagnosis not present

## 2023-02-20 DIAGNOSIS — D485 Neoplasm of uncertain behavior of skin: Secondary | ICD-10-CM | POA: Diagnosis not present

## 2023-02-20 DIAGNOSIS — L570 Actinic keratosis: Secondary | ICD-10-CM | POA: Diagnosis not present

## 2023-02-20 DIAGNOSIS — L218 Other seborrheic dermatitis: Secondary | ICD-10-CM | POA: Diagnosis not present

## 2023-02-20 DIAGNOSIS — Z85828 Personal history of other malignant neoplasm of skin: Secondary | ICD-10-CM | POA: Diagnosis not present

## 2023-02-20 DIAGNOSIS — L821 Other seborrheic keratosis: Secondary | ICD-10-CM | POA: Diagnosis not present

## 2023-02-20 DIAGNOSIS — D2262 Melanocytic nevi of left upper limb, including shoulder: Secondary | ICD-10-CM | POA: Diagnosis not present

## 2023-02-20 DIAGNOSIS — D225 Melanocytic nevi of trunk: Secondary | ICD-10-CM | POA: Diagnosis not present

## 2023-03-21 DIAGNOSIS — H60512 Acute actinic otitis externa, left ear: Secondary | ICD-10-CM | POA: Diagnosis not present

## 2023-03-21 DIAGNOSIS — H6123 Impacted cerumen, bilateral: Secondary | ICD-10-CM | POA: Diagnosis not present

## 2023-03-21 DIAGNOSIS — H9190 Unspecified hearing loss, unspecified ear: Secondary | ICD-10-CM | POA: Diagnosis not present

## 2023-03-21 DIAGNOSIS — L309 Dermatitis, unspecified: Secondary | ICD-10-CM | POA: Diagnosis not present

## 2023-03-27 DIAGNOSIS — H6122 Impacted cerumen, left ear: Secondary | ICD-10-CM | POA: Diagnosis not present

## 2023-03-27 DIAGNOSIS — H6092 Unspecified otitis externa, left ear: Secondary | ICD-10-CM | POA: Diagnosis not present

## 2023-03-27 DIAGNOSIS — H9190 Unspecified hearing loss, unspecified ear: Secondary | ICD-10-CM | POA: Diagnosis not present

## 2023-04-10 ENCOUNTER — Ambulatory Visit: Payer: Medicare Other | Admitting: Podiatry

## 2023-04-14 DIAGNOSIS — L218 Other seborrheic dermatitis: Secondary | ICD-10-CM | POA: Diagnosis not present

## 2023-04-14 DIAGNOSIS — Z85828 Personal history of other malignant neoplasm of skin: Secondary | ICD-10-CM | POA: Diagnosis not present

## 2023-04-14 DIAGNOSIS — L821 Other seborrheic keratosis: Secondary | ICD-10-CM | POA: Diagnosis not present

## 2023-04-17 DIAGNOSIS — R5383 Other fatigue: Secondary | ICD-10-CM | POA: Diagnosis not present

## 2023-04-17 DIAGNOSIS — E559 Vitamin D deficiency, unspecified: Secondary | ICD-10-CM | POA: Diagnosis not present

## 2023-04-17 DIAGNOSIS — E785 Hyperlipidemia, unspecified: Secondary | ICD-10-CM | POA: Diagnosis not present

## 2023-04-20 ENCOUNTER — Ambulatory Visit (INDEPENDENT_AMBULATORY_CARE_PROVIDER_SITE_OTHER): Payer: Medicare Other | Admitting: Podiatry

## 2023-04-20 DIAGNOSIS — M79674 Pain in right toe(s): Secondary | ICD-10-CM

## 2023-04-20 DIAGNOSIS — M79675 Pain in left toe(s): Secondary | ICD-10-CM

## 2023-04-20 DIAGNOSIS — B351 Tinea unguium: Secondary | ICD-10-CM | POA: Diagnosis not present

## 2023-04-22 ENCOUNTER — Encounter: Payer: Self-pay | Admitting: Podiatry

## 2023-04-22 NOTE — Progress Notes (Signed)
  Subjective:  Patient ID: Julia Alvarez, female    DOB: 1945-12-17,  MRN: [id number]  Chief Complaint  Patient presents with   Nail Problem    RFC    77 y.o. female presents with the above complaint. History confirmed with patient.  Overall doing well the nails have become thickened elongated again and causing discomfort especially in the corners, regular debridement has been helpful in reducing pain and improving function. Issue with the toe is doing a little better with offloading  Objective:  Physical Exam: warm, good capillary refill, no trophic changes or ulcerative lesions, normal DP and PT pulses, normal sensory exam, and incurvated ingrowing nails, multiple nails with thickening discoloration and dystrophy with subungual debris.  Assessment:   1. Pain due to onychomycosis of toenails of both feet       Plan:  Patient was evaluated and treated and all questions answered.  Discussed the etiology and treatment options for the condition in detail with the patient.  Prior nail debridement was helpful.  Recommended debridement of the nails today. Sharp and mechanical debridement performed of all painful and mycotic nails today. Nails debrided in length and thickness using a nail nipper to level of comfort. Discussed treatment options including appropriate shoe gear. Follow up as needed for painful nails.  Will consider partial matricectomy  If continuing to worsen    Return in about 3 months (around 07/19/2023) for RFC.

## 2023-04-25 DIAGNOSIS — Z789 Other specified health status: Secondary | ICD-10-CM | POA: Diagnosis not present

## 2023-04-25 DIAGNOSIS — N393 Stress incontinence (female) (male): Secondary | ICD-10-CM | POA: Diagnosis not present

## 2023-04-25 DIAGNOSIS — N952 Postmenopausal atrophic vaginitis: Secondary | ICD-10-CM | POA: Diagnosis not present

## 2023-04-25 DIAGNOSIS — E782 Mixed hyperlipidemia: Secondary | ICD-10-CM | POA: Diagnosis not present

## 2023-04-25 DIAGNOSIS — E559 Vitamin D deficiency, unspecified: Secondary | ICD-10-CM | POA: Diagnosis not present

## 2023-04-25 DIAGNOSIS — M8580 Other specified disorders of bone density and structure, unspecified site: Secondary | ICD-10-CM | POA: Diagnosis not present

## 2023-04-25 DIAGNOSIS — R030 Elevated blood-pressure reading, without diagnosis of hypertension: Secondary | ICD-10-CM | POA: Diagnosis not present

## 2023-05-30 ENCOUNTER — Other Ambulatory Visit: Payer: Self-pay | Admitting: Family Medicine

## 2023-05-30 DIAGNOSIS — Z1231 Encounter for screening mammogram for malignant neoplasm of breast: Secondary | ICD-10-CM

## 2023-07-17 ENCOUNTER — Ambulatory Visit
Admission: RE | Admit: 2023-07-17 | Discharge: 2023-07-17 | Disposition: A | Discharge status: Home / Self Care | Payer: Medicare Other | Source: Ambulatory Visit | Attending: Family Medicine | Admitting: Family Medicine

## 2023-07-17 DIAGNOSIS — Z1231 Encounter for screening mammogram for malignant neoplasm of breast: Secondary | ICD-10-CM

## 2023-07-20 ENCOUNTER — Ambulatory Visit (INDEPENDENT_AMBULATORY_CARE_PROVIDER_SITE_OTHER): Payer: Medicare Other | Admitting: Podiatry

## 2023-07-20 ENCOUNTER — Encounter: Payer: Self-pay | Admitting: Podiatry

## 2023-07-20 DIAGNOSIS — M79675 Pain in left toe(s): Secondary | ICD-10-CM

## 2023-07-20 DIAGNOSIS — M79674 Pain in right toe(s): Secondary | ICD-10-CM

## 2023-07-20 DIAGNOSIS — B351 Tinea unguium: Secondary | ICD-10-CM | POA: Diagnosis not present

## 2023-07-20 NOTE — Progress Notes (Signed)
  Subjective:  Patient ID: Julia Alvarez, female    DOB: [birth year],  MRN: [id number]  Chief Complaint  Patient presents with   Nail Problem    Patient states everything has been fine since last visit, patient is here for RFC    78 y.o. female presents with the above complaint. History confirmed with patient.  Overall doing well the nails have become thickened elongated again and causing discomfort especially in the corners, regular debridement has been helpful in reducing pain and improving function.  Calluses are doing much better after her trip to the beach and walking in the sand   Objective:  Physical Exam: warm, good capillary refill, no trophic changes or ulcerative lesions, normal DP and PT pulses, normal sensory exam, and incurvated ingrowing nails, multiple nails with thickening discoloration and dystrophy with subungual debris.  Assessment:   1. Pain due to onychomycosis of toenails of both feet       Plan:  Patient was evaluated and treated and all questions answered.  Discussed the etiology and treatment options for the condition in detail with the patient.  Prior nail debridement was helpful.  Recommended debridement of the nails today. Sharp and mechanical debridement performed of all painful and mycotic nails today. Nails debrided in length and thickness using a nail nipper to level of comfort.   Return in about 3 months (around 10/20/2023) for RFC.

## 2023-10-23 ENCOUNTER — Ambulatory Visit: Admitting: Podiatry

## 2023-10-24 DIAGNOSIS — I10 Essential (primary) hypertension: Secondary | ICD-10-CM | POA: Diagnosis not present

## 2023-10-24 DIAGNOSIS — F411 Generalized anxiety disorder: Secondary | ICD-10-CM | POA: Diagnosis not present

## 2023-10-24 DIAGNOSIS — G4700 Insomnia, unspecified: Secondary | ICD-10-CM | POA: Diagnosis not present

## 2023-10-24 DIAGNOSIS — F331 Major depressive disorder, recurrent, moderate: Secondary | ICD-10-CM | POA: Diagnosis not present

## 2023-10-24 DIAGNOSIS — N393 Stress incontinence (female) (male): Secondary | ICD-10-CM | POA: Diagnosis not present

## 2023-10-24 DIAGNOSIS — R030 Elevated blood-pressure reading, without diagnosis of hypertension: Secondary | ICD-10-CM | POA: Diagnosis not present

## 2023-11-09 ENCOUNTER — Ambulatory Visit: Admitting: Podiatry

## 2023-11-28 ENCOUNTER — Ambulatory Visit (INDEPENDENT_AMBULATORY_CARE_PROVIDER_SITE_OTHER): Admitting: Podiatry

## 2023-11-28 VITALS — Ht 64.0 in | Wt 121.0 lb

## 2023-11-28 DIAGNOSIS — M79675 Pain in left toe(s): Secondary | ICD-10-CM | POA: Diagnosis not present

## 2023-11-28 DIAGNOSIS — M79674 Pain in right toe(s): Secondary | ICD-10-CM

## 2023-11-28 DIAGNOSIS — B351 Tinea unguium: Secondary | ICD-10-CM | POA: Diagnosis not present

## 2023-11-29 ENCOUNTER — Encounter: Payer: Self-pay | Admitting: Podiatry

## 2023-11-29 NOTE — Progress Notes (Signed)
  Subjective:  Patient ID: KAILEIA FLOW, female    DOB: 18-Feb-1946,  MRN: [id number]  Chief Complaint  Patient presents with   Nail Problem    RM 1 Patient is here routine foot care and nail trimming.    78 y.o. female presents with the above complaint. History confirmed with patient.  Overall doing well the nails have become thickened elongated again and causing discomfort especially in the corners, regular debridement has been helpful in reducing pain and improving function.    Objective:  Physical Exam: warm, good capillary refill, no trophic changes or ulcerative lesions, normal DP and PT pulses, normal sensory exam, and incurvated ingrowing nails, multiple nails with thickening discoloration and dystrophy with subungual debris.  Assessment:   1. Pain due to onychomycosis of toenails of both feet       Plan:  Patient was evaluated and treated and all questions answered.  Discussed the etiology and treatment options for the condition in detail with the patient.  Prior nail debridement was helpful.  Recommended debridement of the nails today. Sharp and mechanical debridement performed of all painful and mycotic nails today. Nails debrided in length and thickness using a nail nipper to level of comfort.   No follow-ups on file.

## 2023-11-30 DIAGNOSIS — H6123 Impacted cerumen, bilateral: Secondary | ICD-10-CM | POA: Diagnosis not present

## 2023-11-30 DIAGNOSIS — R519 Headache, unspecified: Secondary | ICD-10-CM | POA: Diagnosis not present

## 2023-11-30 DIAGNOSIS — M542 Cervicalgia: Secondary | ICD-10-CM | POA: Diagnosis not present

## 2023-11-30 DIAGNOSIS — F411 Generalized anxiety disorder: Secondary | ICD-10-CM | POA: Diagnosis not present

## 2023-12-05 DIAGNOSIS — M810 Age-related osteoporosis without current pathological fracture: Secondary | ICD-10-CM | POA: Diagnosis not present

## 2023-12-05 DIAGNOSIS — R519 Headache, unspecified: Secondary | ICD-10-CM | POA: Diagnosis not present

## 2023-12-05 DIAGNOSIS — M542 Cervicalgia: Secondary | ICD-10-CM | POA: Diagnosis not present

## 2023-12-05 DIAGNOSIS — H6123 Impacted cerumen, bilateral: Secondary | ICD-10-CM | POA: Diagnosis not present

## 2024-01-03 DIAGNOSIS — Z0000 Encounter for general adult medical examination without abnormal findings: Secondary | ICD-10-CM | POA: Diagnosis not present

## 2024-01-03 DIAGNOSIS — Z1339 Encounter for screening examination for other mental health and behavioral disorders: Secondary | ICD-10-CM | POA: Diagnosis not present

## 2024-01-03 DIAGNOSIS — Z1331 Encounter for screening for depression: Secondary | ICD-10-CM | POA: Diagnosis not present

## 2024-01-03 DIAGNOSIS — Z23 Encounter for immunization: Secondary | ICD-10-CM | POA: Diagnosis not present

## 2024-02-19 DIAGNOSIS — Z961 Presence of intraocular lens: Secondary | ICD-10-CM | POA: Diagnosis not present

## 2024-02-19 DIAGNOSIS — H26491 Other secondary cataract, right eye: Secondary | ICD-10-CM | POA: Diagnosis not present

## 2024-02-21 DIAGNOSIS — L570 Actinic keratosis: Secondary | ICD-10-CM | POA: Diagnosis not present

## 2024-02-21 DIAGNOSIS — Z85828 Personal history of other malignant neoplasm of skin: Secondary | ICD-10-CM | POA: Diagnosis not present

## 2024-02-21 DIAGNOSIS — D2272 Melanocytic nevi of left lower limb, including hip: Secondary | ICD-10-CM | POA: Diagnosis not present

## 2024-02-21 DIAGNOSIS — L814 Other melanin hyperpigmentation: Secondary | ICD-10-CM | POA: Diagnosis not present

## 2024-02-21 DIAGNOSIS — L821 Other seborrheic keratosis: Secondary | ICD-10-CM | POA: Diagnosis not present

## 2024-02-21 DIAGNOSIS — D225 Melanocytic nevi of trunk: Secondary | ICD-10-CM | POA: Diagnosis not present

## 2024-02-21 DIAGNOSIS — D485 Neoplasm of uncertain behavior of skin: Secondary | ICD-10-CM | POA: Diagnosis not present

## 2024-02-21 DIAGNOSIS — D2271 Melanocytic nevi of right lower limb, including hip: Secondary | ICD-10-CM | POA: Diagnosis not present

## 2024-02-21 DIAGNOSIS — D1801 Hemangioma of skin and subcutaneous tissue: Secondary | ICD-10-CM | POA: Diagnosis not present

## 2024-02-29 ENCOUNTER — Ambulatory Visit: Admitting: Podiatry

## 2024-02-29 VITALS — Ht 64.0 in | Wt 121.0 lb

## 2024-02-29 DIAGNOSIS — M79675 Pain in left toe(s): Secondary | ICD-10-CM | POA: Diagnosis not present

## 2024-02-29 DIAGNOSIS — B351 Tinea unguium: Secondary | ICD-10-CM

## 2024-02-29 DIAGNOSIS — M79674 Pain in right toe(s): Secondary | ICD-10-CM

## 2024-02-29 NOTE — Progress Notes (Signed)
  Subjective:  Patient ID: Julia Alvarez, female    DOB: 06/27/1945,  MRN: [id number]  Chief Complaint  Patient presents with   Nail Problem    RM 6 RFC    78 y.o. female presents with the above complaint. History confirmed with patient.  Overall doing well the nails have become thickened elongated again and causing discomfort especially in the corners, regular debridement has been helpful in reducing pain and improving function.    Objective:  Physical Exam: warm, good capillary refill, no trophic changes or ulcerative lesions, normal DP and PT pulses, normal sensory exam, and bilateral hallux mycotic and incurvated ingrowing nails, multiple nails with thickening discoloration and dystrophy with subungual debris.  Assessment:   1. Pain due to onychomycosis of toenails of both feet       Plan:  Patient was evaluated and treated and all questions answered.  Discussed the etiology and treatment options for the condition in detail with the patient.  Prior nail debridement was helpful.  Recommended debridement of the nails today. Sharp and mechanical debridement performed of all painful and mycotic nails today. Nails debrided in length and thickness using a nail nipper to level of comfort.   Return in about 3 months (around 05/31/2024) for RFC.

## 2024-04-09 DIAGNOSIS — M47817 Spondylosis without myelopathy or radiculopathy, lumbosacral region: Secondary | ICD-10-CM | POA: Diagnosis not present

## 2024-04-09 DIAGNOSIS — M545 Low back pain, unspecified: Secondary | ICD-10-CM | POA: Diagnosis not present

## 2024-04-09 DIAGNOSIS — E559 Vitamin D deficiency, unspecified: Secondary | ICD-10-CM | POA: Diagnosis not present

## 2024-04-09 DIAGNOSIS — M810 Age-related osteoporosis without current pathological fracture: Secondary | ICD-10-CM | POA: Diagnosis not present

## 2024-04-16 DIAGNOSIS — S22080A Wedge compression fracture of T11-T12 vertebra, initial encounter for closed fracture: Secondary | ICD-10-CM | POA: Diagnosis not present

## 2024-04-16 DIAGNOSIS — E559 Vitamin D deficiency, unspecified: Secondary | ICD-10-CM | POA: Diagnosis not present

## 2024-04-16 DIAGNOSIS — M545 Low back pain, unspecified: Secondary | ICD-10-CM | POA: Diagnosis not present

## 2024-04-24 DIAGNOSIS — N952 Postmenopausal atrophic vaginitis: Secondary | ICD-10-CM | POA: Diagnosis not present

## 2024-04-24 DIAGNOSIS — Z79899 Other long term (current) drug therapy: Secondary | ICD-10-CM | POA: Diagnosis not present

## 2024-04-24 DIAGNOSIS — N393 Stress incontinence (female) (male): Secondary | ICD-10-CM | POA: Diagnosis not present

## 2024-04-24 DIAGNOSIS — S22080A Wedge compression fracture of T11-T12 vertebra, initial encounter for closed fracture: Secondary | ICD-10-CM | POA: Diagnosis not present

## 2024-04-24 DIAGNOSIS — Z789 Other specified health status: Secondary | ICD-10-CM | POA: Diagnosis not present

## 2024-04-24 DIAGNOSIS — E782 Mixed hyperlipidemia: Secondary | ICD-10-CM | POA: Diagnosis not present

## 2024-04-24 DIAGNOSIS — E559 Vitamin D deficiency, unspecified: Secondary | ICD-10-CM | POA: Diagnosis not present

## 2024-06-04 ENCOUNTER — Ambulatory Visit: Admitting: Podiatry

## 2024-06-25 ENCOUNTER — Ambulatory Visit: Admitting: Podiatry
